# Patient Record
Sex: Female | Born: 1955 | Race: White | Hispanic: No | Marital: Married
Health system: Southern US, Community
[De-identification: ages and names within clinical notes are randomized; demographics above are authoritative.]

## PROBLEM LIST (undated history)

## (undated) DIAGNOSIS — U071 COVID-19: Secondary | ICD-10-CM

## (undated) DIAGNOSIS — J449 Chronic obstructive pulmonary disease, unspecified: Secondary | ICD-10-CM

## (undated) DIAGNOSIS — J80 Acute respiratory distress syndrome: Secondary | ICD-10-CM

## (undated) DIAGNOSIS — J4489 Other specified chronic obstructive pulmonary disease: Secondary | ICD-10-CM

## (undated) DIAGNOSIS — J1282 Pneumonia due to coronavirus disease 2019: Secondary | ICD-10-CM

## (undated) DIAGNOSIS — Z85118 Personal history of other malignant neoplasm of bronchus and lung: Secondary | ICD-10-CM

## (undated) DIAGNOSIS — J9621 Acute and chronic respiratory failure with hypoxia: Secondary | ICD-10-CM

---

## 2020-03-28 ENCOUNTER — Inpatient Hospital Stay: Admission: RE | Admit: 2020-03-28 | Payer: Self-pay | Source: Home / Self Care | Admitting: Diagnostic Radiology

## 2020-03-28 ENCOUNTER — Other Ambulatory Visit (HOSPITAL_COMMUNITY): Payer: Medicare HMO

## 2020-03-28 ENCOUNTER — Inpatient Hospital Stay
Admit: 2020-03-28 | Discharge: 2020-04-25 | Disposition: A | Discharge status: Skilled Nursing Facility | Payer: Medicare HMO | Source: Ambulatory Visit | Attending: Internal Medicine | Admitting: Internal Medicine

## 2020-03-28 DIAGNOSIS — J9621 Acute and chronic respiratory failure with hypoxia: Secondary | ICD-10-CM | POA: Diagnosis present

## 2020-03-28 DIAGNOSIS — J9690 Respiratory failure, unspecified, unspecified whether with hypoxia or hypercapnia: Secondary | ICD-10-CM

## 2020-03-28 DIAGNOSIS — J189 Pneumonia, unspecified organism: Secondary | ICD-10-CM

## 2020-03-28 DIAGNOSIS — J1282 Pneumonia due to coronavirus disease 2019: Secondary | ICD-10-CM | POA: Diagnosis present

## 2020-03-28 DIAGNOSIS — J4489 Other specified chronic obstructive pulmonary disease: Secondary | ICD-10-CM | POA: Diagnosis present

## 2020-03-28 DIAGNOSIS — J45909 Unspecified asthma, uncomplicated: Secondary | ICD-10-CM

## 2020-03-28 DIAGNOSIS — J90 Pleural effusion, not elsewhere classified: Secondary | ICD-10-CM

## 2020-03-28 DIAGNOSIS — U071 COVID-19: Secondary | ICD-10-CM | POA: Diagnosis present

## 2020-03-28 DIAGNOSIS — Z85118 Personal history of other malignant neoplasm of bronchus and lung: Secondary | ICD-10-CM

## 2020-03-28 DIAGNOSIS — R069 Unspecified abnormalities of breathing: Secondary | ICD-10-CM

## 2020-03-28 DIAGNOSIS — R059 Cough, unspecified: Secondary | ICD-10-CM

## 2020-03-28 DIAGNOSIS — J80 Acute respiratory distress syndrome: Secondary | ICD-10-CM | POA: Diagnosis present

## 2020-03-28 DIAGNOSIS — J449 Chronic obstructive pulmonary disease, unspecified: Secondary | ICD-10-CM | POA: Diagnosis present

## 2020-03-28 HISTORY — DX: Acute and chronic respiratory failure with hypoxia: J96.21

## 2020-03-28 HISTORY — DX: Acute respiratory distress syndrome: J80

## 2020-03-28 HISTORY — DX: Pneumonia due to coronavirus disease 2019: J12.82

## 2020-03-28 HISTORY — DX: Other specified chronic obstructive pulmonary disease: J44.89

## 2020-03-28 HISTORY — DX: Chronic obstructive pulmonary disease, unspecified: J44.9

## 2020-03-28 HISTORY — DX: Personal history of other malignant neoplasm of bronchus and lung: Z85.118

## 2020-03-28 HISTORY — DX: COVID-19: U07.1

## 2020-03-29 ENCOUNTER — Encounter: Payer: Self-pay | Admitting: Internal Medicine

## 2020-03-29 DIAGNOSIS — J80 Acute respiratory distress syndrome: Secondary | ICD-10-CM | POA: Diagnosis not present

## 2020-03-29 DIAGNOSIS — J1282 Pneumonia due to coronavirus disease 2019: Secondary | ICD-10-CM

## 2020-03-29 DIAGNOSIS — J9621 Acute and chronic respiratory failure with hypoxia: Secondary | ICD-10-CM

## 2020-03-29 DIAGNOSIS — U071 COVID-19: Secondary | ICD-10-CM

## 2020-03-29 DIAGNOSIS — J449 Chronic obstructive pulmonary disease, unspecified: Secondary | ICD-10-CM | POA: Diagnosis not present

## 2020-03-29 DIAGNOSIS — Z85118 Personal history of other malignant neoplasm of bronchus and lung: Secondary | ICD-10-CM

## 2020-03-29 LAB — COMPREHENSIVE METABOLIC PANEL
ALT: 26 U/L (ref 0–44)
AST: 20 U/L (ref 15–41)
Albumin: 3.2 g/dL — ABNORMAL LOW (ref 3.5–5.0)
Alkaline Phosphatase: 39 U/L (ref 38–126)
Anion gap: 10 (ref 5–15)
BUN: 21 mg/dL (ref 8–23)
CO2: 28 mmol/L (ref 22–32)
Calcium: 8.7 mg/dL — ABNORMAL LOW (ref 8.9–10.3)
Chloride: 102 mmol/L (ref 98–111)
Creatinine, Ser: 0.68 mg/dL (ref 0.44–1.00)
GFR, Estimated: >60 mL/min (ref >60)
Glucose, Bld: 84 mg/dL (ref 70–99)
Potassium: 4.5 mmol/L (ref 3.5–5.1)
Sodium: 140 mmol/L (ref 135–145)
Total Bilirubin: 0.7 mg/dL (ref 0.3–1.2)
Total Protein: 6.4 g/dL — ABNORMAL LOW (ref 6.5–8.1)

## 2020-03-29 LAB — CBC WITH DIFFERENTIAL/PLATELET
Abs Immature Granulocytes: 0.26 10*3/uL — ABNORMAL HIGH (ref 0.00–0.07)
Basophils Absolute: 0 10*3/uL (ref 0.0–0.1)
Basophils Relative: 0 %
Eosinophils Absolute: 0 10*3/uL (ref 0.0–0.5)
Eosinophils Relative: 0 %
HCT: 32.8 % — ABNORMAL LOW (ref 36.0–46.0)
Hemoglobin: 10.3 g/dL — ABNORMAL LOW (ref 12.0–15.0)
Immature Granulocytes: 2 %
Lymphocytes Relative: 25 %
Lymphs Abs: 3.5 10*3/uL (ref 0.7–4.0)
MCH: 29 pg (ref 26.0–34.0)
MCHC: 31.4 g/dL (ref 30.0–36.0)
MCV: 92.4 fL (ref 80.0–100.0)
Monocytes Absolute: 0.8 10*3/uL (ref 0.1–1.0)
Monocytes Relative: 6 %
Neutro Abs: 9.2 10*3/uL — ABNORMAL HIGH (ref 1.7–7.7)
Neutrophils Relative %: 67 %
Platelets: 346 10*3/uL (ref 150–400)
RBC: 3.55 MIL/uL — ABNORMAL LOW (ref 3.87–5.11)
RDW: 17.2 % — ABNORMAL HIGH (ref 11.5–15.5)
WBC: 13.9 10*3/uL — ABNORMAL HIGH (ref 4.0–10.5)
nRBC: 0.5 % — ABNORMAL HIGH (ref 0.0–0.2)

## 2020-03-29 LAB — MAGNESIUM: Magnesium: 2.3 mg/dL (ref 1.7–2.4)

## 2020-03-29 LAB — PROTIME-INR
INR: 0.9 (ref 0.8–1.2)
Prothrombin Time: 12.2 seconds (ref 11.4–15.2)

## 2020-03-29 LAB — HEMOGLOBIN A1C
Hgb A1c MFr Bld: 6.3 % — ABNORMAL HIGH (ref 4.8–5.6)
Mean Plasma Glucose: 134.11 mg/dL

## 2020-03-29 LAB — PHOSPHORUS: Phosphorus: 3.4 mg/dL (ref 2.5–4.6)

## 2020-03-29 LAB — TSH: TSH: 10.129 u[IU]/mL — ABNORMAL HIGH (ref 0.350–4.500)

## 2020-03-29 NOTE — Progress Notes (Signed)
Pulmonary Critical Care Medicine [facility] [place]  PULMONARY SERVICE  Date of Service: 03/29/2020  PULMONARY CRITICAL CARE Jaclyn Campbell  [id number]  DOB: 1955/08/08   DOA: 03/28/2020  Referring Physician: Carron Curie, MD  [name] is a 64 y.o. female seen for follow up of Acute on Chronic Respiratory Failure.  Patient with multiple medical problems including history of asthma lung cancer status post resection hypertension morbid obesity who presented to the hospital because of shortness of breath increased heart rate.  Patient had a CT chest negative for pulmonary embolism however test positive for COVID-19.  Patient initially had presented with 15 L nonrebreather now she is on high flow oxygen plus nonrebreather.  Patient was treated with antibiotics she had not been vaccinated for COVID-19.  The patient continued to have high oxygen requirements at the other facility transferred to our facility for further management.  At the time that she is seen she is visibly short of breath drops easily down into the 70s with minimal activity but recovers fairly quickly.  She is a full code.  Review of Systems:  ROS performed and is unremarkable other than noted above.  PAST MEDICAL HISTORY Past Medical History:  Diagnosis Date  . Asthma  . Hypertension  . Lung cancer (HCC)    PAST SURGICAL HISTORY Past Surgical History:  Procedure Laterality Date  . HX SURGICAL OTHER Left 2008  lobe of lumg removed due to cancer   ALLERGIES No Known Allergies  Family history Noncontributory  Social history: Former smoker No alcohol or drug abuse  Medications: Reviewed on Rounds  Physical Exam:  Vitals: Temperature is 97.0 pulse 86 respiratory rate 17 blood pressure is 140/71 saturations 100%  Ventilator Settings on high flow oxygen 30 L with 100% nonrebreather also  . General: Comfortable at this time . Eyes: Grossly normal lids, irises &  conjunctiva . ENT: grossly tongue is normal . Neck: no obvious mass . Cardiovascular: S1-S2 normal no gallop or rub . Respiratory: No rhonchi very coarse breath sounds . Abdomen: Soft and nontender . Skin: no rash seen on limited exam . Musculoskeletal: not rigid . Psychiatric:unable to assess . Neurologic: no seizure no involuntary movements         Labs on Admission:  Basic Metabolic Panel: Recent Labs  Lab 03/29/20 0447  NA 140  K 4.5  CL 102  CO2 28  GLUCOSE 84  BUN 21  CREATININE 0.68  CALCIUM 8.7*  MG 2.3  PHOS 3.4    No results for input(s): PHART, PCO2ART, PO2ART, HCO3, O2SAT in the last 168 hours.  Liver Function Tests: Recent Labs  Lab 03/29/20 0447  AST 20  ALT 26  ALKPHOS 39  BILITOT 0.7  PROT 6.4*  ALBUMIN 3.2*   No results for input(s): LIPASE, AMYLASE in the last 168 hours. No results for input(s): AMMONIA in the last 168 hours.  CBC: Recent Labs  Lab 03/29/20 0447  WBC 13.9*  NEUTROABS 9.2*  HGB 10.3*  HCT 32.8*  MCV 92.4  PLT 346    Cardiac Enzymes: No results for input(s): CKTOTAL, CKMB, CKMBINDEX, TROPONINI in the last 168 hours.  BNP (last 3 results) No results for input(s): BNP in the last 8760 hours.  ProBNP (last 3 results) No results for input(s): PROBNP in the last 8760 hours.   Radiological Exams on Admission: DG Abd 1 View  Result Date: 03/28/2020 CLINICAL DATA:  Pneumonia EXAM: ABDOMEN - 1 VIEW COMPARISON:  None. FINDINGS:  Unremarkable bowel gas pattern. Vascular calcifications in the lower anatomic pelvis. Probable degenerative disc disease at L4-5 and possibly L5-S1 with mild levoconvex lumbar scoliosis. Mild axial degenerative chondral thinning in both hips. IMPRESSION: 1. Unremarkable bowel gas pattern. 2. Degenerative disc disease at L4-5 and possibly L5-S1 with mild levoconvex lumbar scoliosis. Electronically Signed   By: Gaylyn Rong M.D.   On: 03/28/2020 18:24   DG CHEST PORT 1 VIEW  Result Date:  03/28/2020 CLINICAL DATA:  Pneumonia EXAM: PORTABLE CHEST 1 VIEW COMPARISON:  None. FINDINGS: Atherosclerotic calcification of the aortic arch. Vascular clips project over the left hilum and over the neck. The neck clips are probably from prior thyroidectomy based on the pattern. Discontinuity in likely osteotomy of the left fifth rib, correlate with operative history. Thoracic spondylosis. Low lung volumes are present, causing crowding of the pulmonary vasculature. Hazy interstitial accentuation is present bilaterally in the lungs in could be from noncardiogenic edema or atypical pneumonia. Mild blunting of the left lateral costophrenic angle could be postoperative or from a trace pleural effusion. IMPRESSION: 1. Hazy interstitial accentuation bilaterally in the lungs, noncardiogenic edema versus atypical pneumonia. 2. Mild blunting of the left lateral costophrenic angle could be postoperative or from a trace pleural effusion. 3. Atherosclerotic calcification of the aortic arch. Electronically Signed   By: Gaylyn Rong M.D.   On: 03/28/2020 18:23    Assessment/Plan Active Problems:   Acute on chronic respiratory failure with hypoxia (HCC)   COVID-19 virus infection   Acute respiratory distress syndrome (ARDS) (HCC)   Pneumonia due to COVID-19 virus   Chronic obstructive asthma (with obstructive pulmonary disease) (HCC)   History of lung cancer   1. Acute on chronic respiratory failure with hypoxia patient is severely hypoxic has multiple factors including history of lung cancer with resection COPD and then on top of that she had COVID-19 infection.  My concern here is that she is quite frail right now in terms of her oxygenation and she may end up having to be intubated. 2. COVID-19 virus infection in recovery phase still severely hypoxic as noted above. 3. ARDS patient has improved although very slightly radiologically she still has significant bilateral infiltrates. 4. Pneumonia due to  COVID-19 as above has received antibiotics at the other facility we will continue with supportive care. 5. Chronic obstructive asthma she needs to be on nebulizers and consider also a burst of steroids. 6. History of lung cancer status post resection  I have personally seen and evaluated the patient, evaluated laboratory and imaging results, formulated the assessment and plan and placed orders. The Patient requires high complexity decision making with multiple systems involvement.  Case was discussed on Rounds with the Respiratory Therapy Director and the Respiratory staff Time Spent patient is critically ill in danger of cardiac arrest and death  Yevonne Pax, MD [facility] Pulmonary Critical Care Medicine Sleep Medicine

## 2020-03-30 DIAGNOSIS — J9621 Acute and chronic respiratory failure with hypoxia: Secondary | ICD-10-CM | POA: Diagnosis not present

## 2020-03-30 DIAGNOSIS — J80 Acute respiratory distress syndrome: Secondary | ICD-10-CM | POA: Diagnosis not present

## 2020-03-30 DIAGNOSIS — U071 COVID-19: Secondary | ICD-10-CM | POA: Diagnosis not present

## 2020-03-30 DIAGNOSIS — J449 Chronic obstructive pulmonary disease, unspecified: Secondary | ICD-10-CM | POA: Diagnosis not present

## 2020-03-30 NOTE — Progress Notes (Addendum)
Pulmonary Critical Care Medicine [facility] [place]   PULMONARY CRITICAL CARE SERVICE  PROGRESS NOTE  Date of Service: 03/30/2020  Jaclyn Campbell  WVP:[id number]  DOB: 06-03-1955   DOA: 03/28/2020  Referring Physician: Carron Curie, MD  [name] is a 64 y.o. female seen for follow up of Acute on Chronic Respiratory Failure.  Patient remains critically ill requiring high flow oxygen currently is on 30 L she desaturates easily still at risk for respiratory failure and increased chance of being intubated  Medications: Reviewed on Rounds  Physical Exam:  Vitals: Temperature is 96.8 pulse 82 respiratory rate 17 blood pressure is 127/73 saturations 96%  Ventilator Settings on high flow nasal cannula on 30 L of oxygen requiring 50% FiO2  . General: Comfortable at this time . Eyes: Grossly normal lids, irises & conjunctiva . ENT: grossly tongue is normal . Neck: no obvious mass . Cardiovascular: S1 S2 normal no gallop . Respiratory: Scattered rhonchi expansion is equal . Abdomen: soft . Skin: no rash seen on limited exam . Musculoskeletal: not rigid . Psychiatric:unable to assess . Neurologic: no seizure no involuntary movements         Lab Data:   Basic Metabolic Panel: Recent Labs  Lab 03/29/20 0447  NA 140  K 4.5  CL 102  CO2 28  GLUCOSE 84  BUN 21  CREATININE 0.68  CALCIUM 8.7*  MG 2.3  PHOS 3.4    ABG: No results for input(s): PHART, PCO2ART, PO2ART, HCO3, O2SAT in the last 168 hours.  Liver Function Tests: Recent Labs  Lab 03/29/20 0447  AST 20  ALT 26  ALKPHOS 39  BILITOT 0.7  PROT 6.4*  ALBUMIN 3.2*   No results for input(s): LIPASE, AMYLASE in the last 168 hours. No results for input(s): AMMONIA in the last 168 hours.  CBC: Recent Labs  Lab 03/29/20 0447  WBC 13.9*  NEUTROABS 9.2*  HGB 10.3*  HCT 32.8*  MCV 92.4  PLT 346    Cardiac Enzymes: No results for input(s): CKTOTAL, CKMB, CKMBINDEX, TROPONINI in the last  168 hours.  BNP (last 3 results) No results for input(s): BNP in the last 8760 hours.  ProBNP (last 3 results) No results for input(s): PROBNP in the last 8760 hours.  Radiological Exams: DG Abd 1 View  Result Date: 03/28/2020 CLINICAL DATA:  Pneumonia EXAM: ABDOMEN - 1 VIEW COMPARISON:  None. FINDINGS: Unremarkable bowel gas pattern. Vascular calcifications in the lower anatomic pelvis. Probable degenerative disc disease at L4-5 and possibly L5-S1 with mild levoconvex lumbar scoliosis. Mild axial degenerative chondral thinning in both hips. IMPRESSION: 1. Unremarkable bowel gas pattern. 2. Degenerative disc disease at L4-5 and possibly L5-S1 with mild levoconvex lumbar scoliosis. Electronically Signed   By: Gaylyn Rong M.D.   On: 03/28/2020 18:24   DG CHEST PORT 1 VIEW  Result Date: 03/28/2020 CLINICAL DATA:  Pneumonia EXAM: PORTABLE CHEST 1 VIEW COMPARISON:  None. FINDINGS: Atherosclerotic calcification of the aortic arch. Vascular clips project over the left hilum and over the neck. The neck clips are probably from prior thyroidectomy based on the pattern. Discontinuity in likely osteotomy of the left fifth rib, correlate with operative history. Thoracic spondylosis. Low lung volumes are present, causing crowding of the pulmonary vasculature. Hazy interstitial accentuation is present bilaterally in the lungs in could be from noncardiogenic edema or atypical pneumonia. Mild blunting of the left lateral costophrenic angle could be postoperative or from a trace pleural effusion. IMPRESSION: 1. Hazy interstitial accentuation bilaterally  in the lungs, noncardiogenic edema versus atypical pneumonia. 2. Mild blunting of the left lateral costophrenic angle could be postoperative or from a trace pleural effusion. 3. Atherosclerotic calcification of the aortic arch. Electronically Signed   By: Gaylyn Rong M.D.   On: 03/28/2020 18:23    Assessment/Plan Active Problems:   Acute on chronic  respiratory failure with hypoxia (HCC)   COVID-19 virus infection   Acute respiratory distress syndrome (ARDS) (HCC)   Pneumonia due to COVID-19 virus   Chronic obstructive asthma (with obstructive pulmonary disease) (HCC)   History of lung cancer   1. Acute on chronic respiratory failure with hypoxia we will continue with titrating oxygen as tolerated.  She remains high risk of intubation.  Right now she has not been on BiPAP may need to consider using BiPAP at nighttime we will see how the patient does 2. COVID-19 virus infection recovery phase right now still severe hypoxemia 3. ARDS chest x-ray with residual changes although technically in recovery 4. Pneumonia due to COVID-19 has been treated with antibiotics 5. Chronic obstructive asthma continue with pulmonary management. 6. History of lung cancer status post resection   I have personally seen and evaluated the patient, evaluated laboratory and imaging results, formulated the assessment and plan and placed orders. The Patient requires high complexity decision making with multiple systems involvement.  Rounds were done with the Respiratory Therapy Director and Staff therapists and discussed with nursing staff also.  Patient is critically ill in danger of cardiac arrest and death critical care time 35-minute  Yevonne Pax, MD [facility] Pulmonary Critical Care Medicine Sleep Medicine

## 2020-03-31 DIAGNOSIS — J9621 Acute and chronic respiratory failure with hypoxia: Secondary | ICD-10-CM | POA: Diagnosis not present

## 2020-03-31 DIAGNOSIS — J80 Acute respiratory distress syndrome: Secondary | ICD-10-CM | POA: Diagnosis not present

## 2020-03-31 DIAGNOSIS — J449 Chronic obstructive pulmonary disease, unspecified: Secondary | ICD-10-CM | POA: Diagnosis not present

## 2020-03-31 DIAGNOSIS — U071 COVID-19: Secondary | ICD-10-CM | POA: Diagnosis not present

## 2020-03-31 LAB — CBC
HCT: 31.9 % — ABNORMAL LOW (ref 36.0–46.0)
Hemoglobin: 9.9 g/dL — ABNORMAL LOW (ref 12.0–15.0)
MCH: 28.9 pg (ref 26.0–34.0)
MCHC: 31 g/dL (ref 30.0–36.0)
MCV: 93 fL (ref 80.0–100.0)
Platelets: 329 10*3/uL (ref 150–400)
RBC: 3.43 MIL/uL — ABNORMAL LOW (ref 3.87–5.11)
RDW: 17.1 % — ABNORMAL HIGH (ref 11.5–15.5)
WBC: 13 10*3/uL — ABNORMAL HIGH (ref 4.0–10.5)
nRBC: 0.7 % — ABNORMAL HIGH (ref 0.0–0.2)

## 2020-03-31 LAB — URINE CULTURE: Culture: >=100000 — AB

## 2020-03-31 LAB — BASIC METABOLIC PANEL
Anion gap: 11 (ref 5–15)
BUN: 16 mg/dL (ref 8–23)
CO2: 28 mmol/L (ref 22–32)
Calcium: 8.6 mg/dL — ABNORMAL LOW (ref 8.9–10.3)
Chloride: 103 mmol/L (ref 98–111)
Creatinine, Ser: 0.66 mg/dL (ref 0.44–1.00)
GFR, Estimated: >60 mL/min (ref >60)
Glucose, Bld: 64 mg/dL — ABNORMAL LOW (ref 70–99)
Potassium: 4.3 mmol/L (ref 3.5–5.1)
Sodium: 142 mmol/L (ref 135–145)

## 2020-03-31 LAB — T4, FREE: Free T4: 1.17 ng/dL — ABNORMAL HIGH (ref 0.61–1.12)

## 2020-03-31 LAB — PHOSPHORUS: Phosphorus: 3.3 mg/dL (ref 2.5–4.6)

## 2020-03-31 LAB — MAGNESIUM: Magnesium: 2.1 mg/dL (ref 1.7–2.4)

## 2020-03-31 LAB — TSH: TSH: 15.457 u[IU]/mL — ABNORMAL HIGH (ref 0.350–4.500)

## 2020-03-31 NOTE — Progress Notes (Signed)
Pulmonary Critical Care Medicine [facility] [place]   PULMONARY CRITICAL CARE SERVICE  PROGRESS NOTE  Date of Service: 03/31/2020  Jaclyn Campbell  [id number]  DOB: 1955-11-30   DOA: 03/28/2020  Referring Physician: Carron Curie, MD  [name] is a 64 y.o. female seen for follow up of Acute on Chronic Respiratory Failure.  Remains on heated high flow she does desaturate quite easily and requires 100% nonrebreather on top of her heated high flow.  Medications: Reviewed on Rounds  Physical Exam:  Vitals: Temperature is 96.0 pulse 87 respiratory rate 32 blood pressure is 120/67 saturations 99%  Ventilator Settings currently is on heated high flow 30 L with 50% FiO2  . General: Comfortable at this time . Eyes: Grossly normal lids, irises & conjunctiva . ENT: grossly tongue is normal . Neck: no obvious mass . Cardiovascular: S1 S2 normal no gallop . Respiratory: No rhonchi very coarse breath sounds . Abdomen: soft . Skin: no rash seen on limited exam . Musculoskeletal: not rigid . Psychiatric:unable to assess . Neurologic: no seizure no involuntary movements         Lab Data:   Basic Metabolic Panel: Recent Labs  Lab 03/29/20 0447 03/31/20 0627  NA 140 142  K 4.5 4.3  CL 102 103  CO2 28 28  GLUCOSE 84 64*  BUN 21 16  CREATININE 0.68 0.66  CALCIUM 8.7* 8.6*  MG 2.3 2.1  PHOS 3.4 3.3    ABG: No results for input(s): PHART, PCO2ART, PO2ART, HCO3, O2SAT in the last 168 hours.  Liver Function Tests: Recent Labs  Lab 03/29/20 0447  AST 20  ALT 26  ALKPHOS 39  BILITOT 0.7  PROT 6.4*  ALBUMIN 3.2*   No results for input(s): LIPASE, AMYLASE in the last 168 hours. No results for input(s): AMMONIA in the last 168 hours.  CBC: Recent Labs  Lab 03/29/20 0447 03/31/20 0627  WBC 13.9* 13.0*  NEUTROABS 9.2*  --   HGB 10.3* 9.9*  HCT 32.8* 31.9*  MCV 92.4 93.0  PLT 346 329    Cardiac Enzymes: No results for input(s): CKTOTAL, CKMB,  CKMBINDEX, TROPONINI in the last 168 hours.  BNP (last 3 results) No results for input(s): BNP in the last 8760 hours.  ProBNP (last 3 results) No results for input(s): PROBNP in the last 8760 hours.  Radiological Exams: No results found.  Assessment/Plan Active Problems:   Acute on chronic respiratory failure with hypoxia (HCC)   COVID-19 virus infection   Acute respiratory distress syndrome (ARDS) (HCC)   Pneumonia due to COVID-19 virus   Chronic obstructive asthma (with obstructive pulmonary disease) (HCC)   History of lung cancer   1. Acute on chronic respiratory failure hypoxia we will continue with heated high flow supplemented by 100% FiO2 her overall prognosis very guarded spoke with her at length and explained to her that she could turn for the worse and required intubation but we are going to obviously hold off on that as long as we possibly can 2. COVID-19 virus infection in recovery we will continue with supportive care. 3. ARDS severe disease with severe hypoxia still present we will continue with oxygen supplementation and monitor closely. 4. Chronic obstructive asthma nebulizers as necessary 5. History of lung cancer with resection   I have personally seen and evaluated the patient, evaluated laboratory and imaging results, formulated the assessment and plan and placed orders. The Patient requires high complexity decision making with multiple systems involvement.  Rounds were  done with the Respiratory Therapy Director and Staff therapists and discussed with nursing staff also.  Allyne Gee, MD [facility] Pulmonary Critical Care Medicine Sleep Medicine

## 2020-04-01 DIAGNOSIS — J449 Chronic obstructive pulmonary disease, unspecified: Secondary | ICD-10-CM | POA: Diagnosis not present

## 2020-04-01 DIAGNOSIS — U071 COVID-19: Secondary | ICD-10-CM | POA: Diagnosis not present

## 2020-04-01 DIAGNOSIS — J80 Acute respiratory distress syndrome: Secondary | ICD-10-CM | POA: Diagnosis not present

## 2020-04-01 DIAGNOSIS — J9621 Acute and chronic respiratory failure with hypoxia: Secondary | ICD-10-CM | POA: Diagnosis not present

## 2020-04-01 NOTE — Progress Notes (Signed)
Pulmonary Critical Care Medicine [facility] [place]   PULMONARY CRITICAL CARE SERVICE  PROGRESS NOTE  Date of Service: 04/01/2020  Jaclyn Campbell  [id number]  DOB: [birth year]   DOA: 03/28/2020  Referring Physician: Carron Curie, MD  [name] is a 64 y.o. female seen for follow up of Acute on Chronic Respiratory Failure.  Patient currently is on 30 L of oxygen with 50% FiO2 still has easy desaturations on ambulation  Medications: Reviewed on Rounds  Physical Exam:  Vitals: Temperature is 96.7 pulse 76 respiratory 15 blood pressure is 131/76 saturations 100%  Ventilator Settings on 30 L with an FiO2 of 50%  . General: Comfortable at this time . Eyes: Grossly normal lids, irises & conjunctiva . ENT: grossly tongue is normal . Neck: no obvious mass . Cardiovascular: S1 S2 normal no gallop . Respiratory: No rhonchi very coarse breath sounds . Abdomen: soft . Skin: no rash seen on limited exam . Musculoskeletal: not rigid . Psychiatric:unable to assess . Neurologic: no seizure no involuntary movements         Lab Data:   Basic Metabolic Panel: Recent Labs  Lab 03/29/20 0447 03/31/20 0627  NA 140 142  K 4.5 4.3  CL 102 103  CO2 28 28  GLUCOSE 84 64*  BUN 21 16  CREATININE 0.68 0.66  CALCIUM 8.7* 8.6*  MG 2.3 2.1  PHOS 3.4 3.3    ABG: No results for input(s): PHART, PCO2ART, PO2ART, HCO3, O2SAT in the last 168 hours.  Liver Function Tests: Recent Labs  Lab 03/29/20 0447  AST 20  ALT 26  ALKPHOS 39  BILITOT 0.7  PROT 6.4*  ALBUMIN 3.2*   No results for input(s): LIPASE, AMYLASE in the last 168 hours. No results for input(s): AMMONIA in the last 168 hours.  CBC: Recent Labs  Lab 03/29/20 0447 03/31/20 0627  WBC 13.9* 13.0*  NEUTROABS 9.2*  --   HGB 10.3* 9.9*  HCT 32.8* 31.9*  MCV 92.4 93.0  PLT 346 329    Cardiac Enzymes: No results for input(s): CKTOTAL, CKMB, CKMBINDEX, TROPONINI in the last 168 hours.  BNP (last  3 results) No results for input(s): BNP in the last 8760 hours.  ProBNP (last 3 results) No results for input(s): PROBNP in the last 8760 hours.  Radiological Exams: No results found.  Assessment/Plan Active Problems:   Acute on chronic respiratory failure with hypoxia (HCC)   COVID-19 virus infection   Acute respiratory distress syndrome (ARDS) (HCC)   Pneumonia due to COVID-19 virus   Chronic obstructive asthma (with obstructive pulmonary disease) (HCC)   History of lung cancer   1. Acute on chronic respiratory failure hypoxia we will continue with oxygen therapy at 30L flow rate with currently on 50% FiO2 titrate oxygen down as tolerated.  Patient still has significant desaturations noted 2. COVID-19 virus infection recovery 3. ARDS very slow to improve 4. Pneumonia due to COVID-19 treated 5. Chronic asthma at baseline 6. Lung cancer status post resection   I have personally seen and evaluated the patient, evaluated laboratory and imaging results, formulated the assessment and plan and placed orders. The Patient requires high complexity decision making with multiple systems involvement.  Rounds were done with the Respiratory Therapy Director and Staff therapists and discussed with nursing staff also.  Yevonne Pax, MD [facility] Pulmonary Critical Care Medicine Sleep Medicine

## 2020-04-02 DIAGNOSIS — U071 COVID-19: Secondary | ICD-10-CM | POA: Diagnosis not present

## 2020-04-02 DIAGNOSIS — J449 Chronic obstructive pulmonary disease, unspecified: Secondary | ICD-10-CM | POA: Diagnosis not present

## 2020-04-02 DIAGNOSIS — J9621 Acute and chronic respiratory failure with hypoxia: Secondary | ICD-10-CM | POA: Diagnosis not present

## 2020-04-02 DIAGNOSIS — J80 Acute respiratory distress syndrome: Secondary | ICD-10-CM | POA: Diagnosis not present

## 2020-04-02 NOTE — Progress Notes (Signed)
Pulmonary Critical Care Medicine [facility] [place]   PULMONARY CRITICAL CARE SERVICE  PROGRESS NOTE  Date of Service: 04/02/2020  Jaclyn Campbell  [id number]  DOB: 06-20-1955   DOA: 03/28/2020  Referring Physician: Carron Curie, MD  [name] is a 64 y.o. female seen for follow up of Acute on Chronic Respiratory Failure.  Patient currently is on 30 L of oxygen has been requiring 50% FiO2.  She appears to be more comfortable today  Medications: Reviewed on Rounds  Physical Exam:  Vitals: Temperature is 97.6 pulse of 97 respiratory rate 18 blood pressure is 117/78 saturations are 97%  Ventilator Settings currently on 30 L with an FiO2 of 50%  . General: Comfortable at this time . Eyes: Grossly normal lids, irises & conjunctiva . ENT: grossly tongue is normal . Neck: no obvious mass . Cardiovascular: S1 S2 normal no gallop . Respiratory: No rhonchi very coarse breath sounds . Abdomen: soft . Skin: no rash seen on limited exam . Musculoskeletal: not rigid . Psychiatric:unable to assess . Neurologic: no seizure no involuntary movements         Lab Data:   Basic Metabolic Panel: Recent Labs  Lab 03/29/20 0447 03/31/20 0627  NA 140 142  K 4.5 4.3  CL 102 103  CO2 28 28  GLUCOSE 84 64*  BUN 21 16  CREATININE 0.68 0.66  CALCIUM 8.7* 8.6*  MG 2.3 2.1  PHOS 3.4 3.3    ABG: No results for input(s): PHART, PCO2ART, PO2ART, HCO3, O2SAT in the last 168 hours.  Liver Function Tests: Recent Labs  Lab 03/29/20 0447  AST 20  ALT 26  ALKPHOS 39  BILITOT 0.7  PROT 6.4*  ALBUMIN 3.2*   No results for input(s): LIPASE, AMYLASE in the last 168 hours. No results for input(s): AMMONIA in the last 168 hours.  CBC: Recent Labs  Lab 03/29/20 0447 03/31/20 0627  WBC 13.9* 13.0*  NEUTROABS 9.2*  --   HGB 10.3* 9.9*  HCT 32.8* 31.9*  MCV 92.4 93.0  PLT 346 329    Cardiac Enzymes: No results for input(s): CKTOTAL, CKMB, CKMBINDEX, TROPONINI  in the last 168 hours.  BNP (last 3 results) No results for input(s): BNP in the last 8760 hours.  ProBNP (last 3 results) No results for input(s): PROBNP in the last 8760 hours.  Radiological Exams: No results found.  Assessment/Plan Active Problems:   Acute on chronic respiratory failure with hypoxia (HCC)   COVID-19 virus infection   Acute respiratory distress syndrome (ARDS) (HCC)   Pneumonia due to COVID-19 virus   Chronic obstructive asthma (with obstructive pulmonary disease) (HCC)   History of lung cancer   1. Acute on chronic respiratory failure with hypoxia we will continue with the oxygen at 30 L try to titrate the FiO2 down. 2. COVID-19 virus infection in recovery phase we will continue to monitor 3. ARDS treated slowly improving 4. Pneumonia due to COVID-19 appears to be improving 5. Chronic obstructive asthma patient is at baseline 6. History of lung cancer no change   I have personally seen and evaluated the patient, evaluated laboratory and imaging results, formulated the assessment and plan and placed orders. The Patient requires high complexity decision making with multiple systems involvement.  Rounds were done with the Respiratory Therapy Director and Staff therapists and discussed with nursing staff also.  Yevonne Pax, MD [facility] Pulmonary Critical Care Medicine Sleep Medicine

## 2020-04-03 DIAGNOSIS — J9621 Acute and chronic respiratory failure with hypoxia: Secondary | ICD-10-CM | POA: Diagnosis not present

## 2020-04-03 DIAGNOSIS — U071 COVID-19: Secondary | ICD-10-CM | POA: Diagnosis not present

## 2020-04-03 DIAGNOSIS — J80 Acute respiratory distress syndrome: Secondary | ICD-10-CM | POA: Diagnosis not present

## 2020-04-03 DIAGNOSIS — J449 Chronic obstructive pulmonary disease, unspecified: Secondary | ICD-10-CM | POA: Diagnosis not present

## 2020-04-03 LAB — VANCOMYCIN, TROUGH: Vancomycin Tr: 20 ug/mL (ref 15–20)

## 2020-04-04 DIAGNOSIS — J80 Acute respiratory distress syndrome: Secondary | ICD-10-CM | POA: Diagnosis not present

## 2020-04-04 DIAGNOSIS — J449 Chronic obstructive pulmonary disease, unspecified: Secondary | ICD-10-CM | POA: Diagnosis not present

## 2020-04-04 DIAGNOSIS — U071 COVID-19: Secondary | ICD-10-CM | POA: Diagnosis not present

## 2020-04-04 DIAGNOSIS — J9621 Acute and chronic respiratory failure with hypoxia: Secondary | ICD-10-CM | POA: Diagnosis not present

## 2020-04-04 NOTE — Progress Notes (Signed)
Pulmonary Critical Care Medicine [facility] [place]   PULMONARY CRITICAL CARE SERVICE  PROGRESS NOTE  Date of Service: 04/04/2020  Jaclyn Campbell  [id number]  DOB: 01/24/1956   DOA: 03/28/2020  Referring Physician: Carron Curie, MD  [name] is a 64 y.o. female seen for follow up of Acute on Chronic Respiratory Failure.  Doing little bit better oxygen is down to 25 L with 45% FiO2.  Discussed possibility of placing her on Oxymizer respiratory therapy wants to hold off for now as she still does desaturate rather easily  Medications: Reviewed on Rounds  Physical Exam:  Vitals: Temperature is 97.3 pulse 92 respiratory rate 18 blood pressure is 150/87 saturations 93%  Ventilator Settings on 25 L with an FiO2 of 45%  . General: Comfortable at this time . Eyes: Grossly normal lids, irises & conjunctiva . ENT: grossly tongue is normal . Neck: no obvious mass . Cardiovascular: S1 S2 normal no gallop . Respiratory: No rhonchi very coarse breath . Abdomen: soft . Skin: no rash seen on limited exam . Musculoskeletal: not rigid . Psychiatric:unable to assess . Neurologic: no seizure no involuntary movements         Lab Data:   Basic Metabolic Panel: Recent Labs  Lab 03/29/20 0447 03/31/20 0627  NA 140 142  K 4.5 4.3  CL 102 103  CO2 28 28  GLUCOSE 84 64*  BUN 21 16  CREATININE 0.68 0.66  CALCIUM 8.7* 8.6*  MG 2.3 2.1  PHOS 3.4 3.3    ABG: No results for input(s): PHART, PCO2ART, PO2ART, HCO3, O2SAT in the last 168 hours.  Liver Function Tests: Recent Labs  Lab 03/29/20 0447  AST 20  ALT 26  ALKPHOS 39  BILITOT 0.7  PROT 6.4*  ALBUMIN 3.2*   No results for input(s): LIPASE, AMYLASE in the last 168 hours. No results for input(s): AMMONIA in the last 168 hours.  CBC: Recent Labs  Lab 03/29/20 0447 03/31/20 0627  WBC 13.9* 13.0*  NEUTROABS 9.2*  --   HGB 10.3* 9.9*  HCT 32.8* 31.9*  MCV 92.4 93.0  PLT 346 329    Cardiac  Enzymes: No results for input(s): CKTOTAL, CKMB, CKMBINDEX, TROPONINI in the last 168 hours.  BNP (last 3 results) No results for input(s): BNP in the last 8760 hours.  ProBNP (last 3 results) No results for input(s): PROBNP in the last 8760 hours.  Radiological Exams: No results found.  Assessment/Plan Active Problems:   Acute on chronic respiratory failure with hypoxia (HCC)   COVID-19 virus infection   Acute respiratory distress syndrome (ARDS) (HCC)   Pneumonia due to COVID-19 virus   Chronic obstructive asthma (with obstructive pulmonary disease) (HCC)   History of lung cancer   1. Acute on chronic respiratory failure hypoxia patient currently is on 25 L 45% FiO2.  Issues with anxiety are being addressed by the primary care team plan is to continue with slowly weaning the oxygen. 2. COVID-19 virus infection in recovery we will continue with supportive care 3. ARDS resolving but still with high oxygen requirements 4. Pneumonia due to COVID-19 has been treated patient did receive antibiotics at the other facility 5. Chronic obstructive asthma nebulizers as necessary 6. History of lung cancer status post resection   I have personally seen and evaluated the patient, evaluated laboratory and imaging results, formulated the assessment and plan and placed orders. The Patient requires high complexity decision making with multiple systems involvement.  Rounds were done with the  Respiratory Therapy Director and Staff therapists and discussed with nursing staff also.  Allyne Gee, MD [facility] Pulmonary Critical Care Medicine Sleep Medicine

## 2020-04-04 NOTE — Progress Notes (Signed)
Pulmonary Critical Care Medicine [facility] [place]   PULMONARY CRITICAL CARE SERVICE  PROGRESS NOTE  Date of Service: 04/03/2020  Jaclyn Campbell  [id number]  DOB: 08-15-1955   DOA: 03/28/2020  Referring Physician: Carron Curie, MD  [name] is a 64 y.o. female seen for follow up of Acute on Chronic Respiratory Failure.  Patient remains on heated high flow right now has been on 25 L down to 45% FiO2  Medications: Reviewed on Rounds  Physical Exam:  Vitals: Temperature 97.1 pulse 83 respiratory rate 17 blood pressure is 121/72  Ventilator Settings off the ventilator on high flow oxygen right now is 25 L FiO2 45%  . General: Comfortable at this time . Eyes: Grossly normal lids, irises & conjunctiva . ENT: grossly tongue is normal . Neck: no obvious mass . Cardiovascular: S1 S2 normal no gallop . Respiratory: No rhonchi no rales are noted at this time . Abdomen: soft . Skin: no rash seen on limited exam . Musculoskeletal: not rigid . Psychiatric:unable to assess . Neurologic: no seizure no involuntary movements         Lab Data:   Basic Metabolic Panel: Recent Labs  Lab 03/29/20 0447 03/31/20 0627  NA 140 142  K 4.5 4.3  CL 102 103  CO2 28 28  GLUCOSE 84 64*  BUN 21 16  CREATININE 0.68 0.66  CALCIUM 8.7* 8.6*  MG 2.3 2.1  PHOS 3.4 3.3    ABG: No results for input(s): PHART, PCO2ART, PO2ART, HCO3, O2SAT in the last 168 hours.  Liver Function Tests: Recent Labs  Lab 03/29/20 0447  AST 20  ALT 26  ALKPHOS 39  BILITOT 0.7  PROT 6.4*  ALBUMIN 3.2*   No results for input(s): LIPASE, AMYLASE in the last 168 hours. No results for input(s): AMMONIA in the last 168 hours.  CBC: Recent Labs  Lab 03/29/20 0447 03/31/20 0627  WBC 13.9* 13.0*  NEUTROABS 9.2*  --   HGB 10.3* 9.9*  HCT 32.8* 31.9*  MCV 92.4 93.0  PLT 346 329    Cardiac Enzymes: No results for input(s): CKTOTAL, CKMB, CKMBINDEX, TROPONINI in the last 168  hours.  BNP (last 3 results) No results for input(s): BNP in the last 8760 hours.  ProBNP (last 3 results) No results for input(s): PROBNP in the last 8760 hours.  Radiological Exams: No results found.  Assessment/Plan Active Problems:   Acute on chronic respiratory failure with hypoxia (HCC)   COVID-19 virus infection   Acute respiratory distress syndrome (ARDS) (HCC)   Pneumonia due to COVID-19 virus   Chronic obstructive asthma (with obstructive pulmonary disease) (HCC)   History of lung cancer   1. Acute on chronic respiratory failure hypoxia try to titrate oxygen down as tolerated now is down to 25 L 2. COVID-19 virus infection recovery 3. Acute respiratory distress no improvement 4. Pneumonia due to COVID-19 treated 5. Chronic obstructive asthma inhalers as necessary 6. History of lung cancer status post resection   I have personally seen and evaluated the patient, evaluated laboratory and imaging results, formulated the assessment and plan and placed orders. The Patient requires high complexity decision making with multiple systems involvement.  Rounds were done with the Respiratory Therapy Director and Staff therapists and discussed with nursing staff also.  Yevonne Pax, MD [facility] Pulmonary Critical Care Medicine Sleep Medicine

## 2020-04-05 DIAGNOSIS — U071 COVID-19: Secondary | ICD-10-CM | POA: Diagnosis not present

## 2020-04-05 DIAGNOSIS — J449 Chronic obstructive pulmonary disease, unspecified: Secondary | ICD-10-CM | POA: Diagnosis not present

## 2020-04-05 DIAGNOSIS — J9621 Acute and chronic respiratory failure with hypoxia: Secondary | ICD-10-CM | POA: Diagnosis not present

## 2020-04-05 DIAGNOSIS — J80 Acute respiratory distress syndrome: Secondary | ICD-10-CM | POA: Diagnosis not present

## 2020-04-05 LAB — BASIC METABOLIC PANEL
Anion gap: 10 (ref 5–15)
BUN: 13 mg/dL (ref 8–23)
CO2: 27 mmol/L (ref 22–32)
Calcium: 8.6 mg/dL — ABNORMAL LOW (ref 8.9–10.3)
Chloride: 106 mmol/L (ref 98–111)
Creatinine, Ser: 0.61 mg/dL (ref 0.44–1.00)
GFR, Estimated: >60 mL/min (ref >60)
Glucose, Bld: 57 mg/dL — ABNORMAL LOW (ref 70–99)
Potassium: 3.9 mmol/L (ref 3.5–5.1)
Sodium: 143 mmol/L (ref 135–145)

## 2020-04-05 LAB — CBC
HCT: 28.3 % — ABNORMAL LOW (ref 36.0–46.0)
Hemoglobin: 9.2 g/dL — ABNORMAL LOW (ref 12.0–15.0)
MCH: 30.5 pg (ref 26.0–34.0)
MCHC: 32.5 g/dL (ref 30.0–36.0)
MCV: 93.7 fL (ref 80.0–100.0)
Platelets: 299 10*3/uL (ref 150–400)
RBC: 3.02 MIL/uL — ABNORMAL LOW (ref 3.87–5.11)
RDW: 18.7 % — ABNORMAL HIGH (ref 11.5–15.5)
WBC: 10.6 10*3/uL — ABNORMAL HIGH (ref 4.0–10.5)
nRBC: 0.2 % (ref 0.0–0.2)

## 2020-04-05 LAB — MAGNESIUM: Magnesium: 2 mg/dL (ref 1.7–2.4)

## 2020-04-05 NOTE — Progress Notes (Signed)
Pulmonary Critical Care Medicine [facility] [place]   PULMONARY CRITICAL CARE SERVICE  PROGRESS NOTE  Date of Service: 04/05/2020  Jaclyn Campbell  [id number]  DOB: 22-Apr-1955   DOA: 03/28/2020  Referring Physician: Carron Curie, MD  [name] is a 64 y.o. female seen for follow up of Acute on Chronic Respiratory Failure.  Patient is currently on 7 L Oxymizer is doing better than she was over the last couple of days.  Medications: Reviewed on Rounds  Physical Exam:  Vitals: Temperature 98.1 pulse 89 respiratory rate 18 blood pressure is 159/69 saturations 96%  Ventilator Settings on 7 L Oxymizer  . General: Comfortable at this time . Eyes: Grossly normal lids, irises & conjunctiva . ENT: grossly tongue is normal . Neck: no obvious mass . Cardiovascular: S1 S2 normal no gallop . Respiratory: No rhonchi no rales . Abdomen: soft . Skin: no rash seen on limited exam . Musculoskeletal: not rigid . Psychiatric:unable to assess . Neurologic: no seizure no involuntary movements         Lab Data:   Basic Metabolic Panel: Recent Labs  Lab 03/31/20 0627 04/05/20 0431  NA 142 143  K 4.3 3.9  CL 103 106  CO2 28 27  GLUCOSE 64* 57*  BUN 16 13  CREATININE 0.66 0.61  CALCIUM 8.6* 8.6*  MG 2.1 2.0  PHOS 3.3  --     ABG: No results for input(s): PHART, PCO2ART, PO2ART, HCO3, O2SAT in the last 168 hours.  Liver Function Tests: No results for input(s): AST, ALT, ALKPHOS, BILITOT, PROT, ALBUMIN in the last 168 hours. No results for input(s): LIPASE, AMYLASE in the last 168 hours. No results for input(s): AMMONIA in the last 168 hours.  CBC: Recent Labs  Lab 03/31/20 0627 04/05/20 0431  WBC 13.0* 10.6*  HGB 9.9* 9.2*  HCT 31.9* 28.3*  MCV 93.0 93.7  PLT 329 299    Cardiac Enzymes: No results for input(s): CKTOTAL, CKMB, CKMBINDEX, TROPONINI in the last 168 hours.  BNP (last 3 results) No results for input(s): BNP in the last 8760  hours.  ProBNP (last 3 results) No results for input(s): PROBNP in the last 8760 hours.  Radiological Exams: No results found.  Assessment/Plan Active Problems:   Acute on chronic respiratory failure with hypoxia (HCC)   COVID-19 virus infection   Acute respiratory distress syndrome (ARDS) (HCC)   Pneumonia due to COVID-19 virus   Chronic obstructive asthma (with obstructive pulmonary disease) (HCC)   History of lung cancer   1. Acute on chronic respiratory failure hypoxia we will continue with Oxymizer titrate oxygen continue pulmonary toilet. 2. Chronic virus infection recovery 3. ARDS.  Slow improvement 4. Pneumonia due to COVID-19 treated we will continue to follow 5. Chronic obstructive asthma at baseline 6. History of lung cancer status post resection   I have personally seen and evaluated the patient, evaluated laboratory and imaging results, formulated the assessment and plan and placed orders. The Patient requires high complexity decision making with multiple systems involvement.  Rounds were done with the Respiratory Therapy Director and Staff therapists and discussed with nursing staff also.  Yevonne Pax, MD [facility] Pulmonary Critical Care Medicine Sleep Medicine

## 2020-04-06 DIAGNOSIS — J9621 Acute and chronic respiratory failure with hypoxia: Secondary | ICD-10-CM | POA: Diagnosis not present

## 2020-04-06 DIAGNOSIS — J80 Acute respiratory distress syndrome: Secondary | ICD-10-CM | POA: Diagnosis not present

## 2020-04-06 DIAGNOSIS — U071 COVID-19: Secondary | ICD-10-CM | POA: Diagnosis not present

## 2020-04-06 DIAGNOSIS — J449 Chronic obstructive pulmonary disease, unspecified: Secondary | ICD-10-CM | POA: Diagnosis not present

## 2020-04-06 NOTE — Progress Notes (Signed)
Pulmonary Critical Care Medicine [facility] [place]   PULMONARY CRITICAL CARE SERVICE  PROGRESS NOTE  Date of Service: 04/06/2020  Jaclyn Campbell  [id number]  DOB: 11-30-1955   DOA: 03/28/2020  Referring Physician: Carron Curie, MD  [name] is a 64 y.o. female seen for follow up of Acute on Chronic Respiratory Failure.  Patient currently is on 5 L of Oxymizer appears to be doing well with her with saturations of 92%.  Using the nebulizers as necessary.  Still she has a lot of anxiety issues going on.  Medications: Reviewed on Rounds  Physical Exam:  Vitals: Temperature is 97.9 pulse ninety-one respiratory twenty-two blood pressure is 154/90 saturations 98%  Ventilator Settings off the ventilator on 5 L Oxymizer  . General: Comfortable at this time . Eyes: Grossly normal lids, irises & conjunctiva . ENT: grossly tongue is normal . Neck: no obvious mass . Cardiovascular: S1 S2 normal no gallop . Respiratory: No rhonchi very coarse breath sounds are noted . Abdomen: soft . Skin: no rash seen on limited exam . Musculoskeletal: not rigid . Psychiatric:unable to assess . Neurologic: no seizure no involuntary movements         Lab Data:   Basic Metabolic Panel: Recent Labs  Lab 03/31/20 0627 04/05/20 0431  NA 142 143  K 4.3 3.9  CL 103 106  CO2 28 27  GLUCOSE 64* 57*  BUN 16 13  CREATININE 0.66 0.61  CALCIUM 8.6* 8.6*  MG 2.1 2.0  PHOS 3.3  --     ABG: No results for input(s): PHART, PCO2ART, PO2ART, HCO3, O2SAT in the last 168 hours.  Liver Function Tests: No results for input(s): AST, ALT, ALKPHOS, BILITOT, PROT, ALBUMIN in the last 168 hours. No results for input(s): LIPASE, AMYLASE in the last 168 hours. No results for input(s): AMMONIA in the last 168 hours.  CBC: Recent Labs  Lab 03/31/20 0627 04/05/20 0431  WBC 13.0* 10.6*  HGB 9.9* 9.2*  HCT 31.9* 28.3*  MCV 93.0 93.7  PLT 329 299    Cardiac Enzymes: No results for  input(s): CKTOTAL, CKMB, CKMBINDEX, TROPONINI in the last 168 hours.  BNP (last 3 results) No results for input(s): BNP in the last 8760 hours.  ProBNP (last 3 results) No results for input(s): PROBNP in the last 8760 hours.  Radiological Exams: No results found.  Assessment/Plan Active Problems:   Acute on chronic respiratory failure with hypoxia (HCC)   COVID-19 virus infection   Acute respiratory distress syndrome (ARDS) (HCC)   Pneumonia due to COVID-19 virus   Chronic obstructive asthma (with obstructive pulmonary disease) (HCC)   History of lung cancer   1. Acute on chronic respiratory failure hypoxia we'll continue with Oxymizer titrate oxygen continue pulmonary toilet.  Anxiety issues seem to be playing a part in her currently active oxygen as well 2. COVID-19 virus infection recovery phase 3. ARDS treated slowly improving 4. Pneumonia due to COVID-19 treated with antibiotics 5. Chronic asthma she needs to be continued on her nebulizers. 6. History of lung cancer status post resection   I have personally seen and evaluated the patient, evaluated laboratory and imaging results, formulated the assessment and plan and placed orders. The Patient requires high complexity decision making with multiple systems involvement.  Rounds were done with the Respiratory Therapy Director and Staff therapists and discussed with nursing staff also.  Yevonne Pax, MD [facility] Pulmonary Critical Care Medicine Sleep Medicine

## 2020-04-07 ENCOUNTER — Other Ambulatory Visit (HOSPITAL_COMMUNITY): Payer: Medicare HMO

## 2020-04-07 DIAGNOSIS — J449 Chronic obstructive pulmonary disease, unspecified: Secondary | ICD-10-CM | POA: Diagnosis not present

## 2020-04-07 DIAGNOSIS — U071 COVID-19: Secondary | ICD-10-CM | POA: Diagnosis not present

## 2020-04-07 DIAGNOSIS — J9621 Acute and chronic respiratory failure with hypoxia: Secondary | ICD-10-CM | POA: Diagnosis not present

## 2020-04-07 DIAGNOSIS — J80 Acute respiratory distress syndrome: Secondary | ICD-10-CM | POA: Diagnosis not present

## 2020-04-07 NOTE — Progress Notes (Signed)
Pulmonary Critical Care Medicine [facility] [place]   PULMONARY CRITICAL CARE SERVICE  PROGRESS NOTE  Date of Service: 04/07/2020  Jaclyn Campbell  [id number]  DOB: 03-08-1956   DOA: 03/28/2020  Referring Physician: Carron Curie, MD  [name] is a 65 y.o. female seen for follow up of Acute on Chronic Respiratory Failure.  Currently is on 5 L of oxygen she still has quite a lot of anxiety.  Spoke to her explained to her that she is doing better she appeared to calm down significantly after I were checked.  Right now is on Oxymizer  Medications: Reviewed on Rounds  Physical Exam:  Vitals: Temperature is 97.6 pulse 93 respiratory rate is 15 blood pressure is 168/91 saturations 98%  Ventilator Settings on 5 L Oxymizer  . General: Comfortable at this time . Eyes: Grossly normal lids, irises & conjunctiva . ENT: grossly tongue is normal . Neck: no obvious mass . Cardiovascular: S1 S2 normal no gallop . Respiratory: No rhonchi coarse breath sounds . Abdomen: soft . Skin: no rash seen on limited exam . Musculoskeletal: not rigid . Psychiatric:unable to assess . Neurologic: no seizure no involuntary movements         Lab Data:   Basic Metabolic Panel: Recent Labs  Lab 04/05/20 0431  NA 143  K 3.9  CL 106  CO2 27  GLUCOSE 57*  BUN 13  CREATININE 0.61  CALCIUM 8.6*  MG 2.0    ABG: No results for input(s): PHART, PCO2ART, PO2ART, HCO3, O2SAT in the last 168 hours.  Liver Function Tests: No results for input(s): AST, ALT, ALKPHOS, BILITOT, PROT, ALBUMIN in the last 168 hours. No results for input(s): LIPASE, AMYLASE in the last 168 hours. No results for input(s): AMMONIA in the last 168 hours.  CBC: Recent Labs  Lab 04/05/20 0431  WBC 10.6*  HGB 9.2*  HCT 28.3*  MCV 93.7  PLT 299    Cardiac Enzymes: No results for input(s): CKTOTAL, CKMB, CKMBINDEX, TROPONINI in the last 168 hours.  BNP (last 3 results) No results for input(s): BNP  in the last 8760 hours.  ProBNP (last 3 results) No results for input(s): PROBNP in the last 8760 hours.  Radiological Exams: No results found.  Assessment/Plan Active Problems:   Acute on chronic respiratory failure with hypoxia (HCC)   COVID-19 virus infection   Acute respiratory distress syndrome (ARDS) (HCC)   Pneumonia due to COVID-19 virus   Chronic obstructive asthma (with obstructive pulmonary disease) (HCC)   History of lung cancer   1. Acute on chronic respiratory failure hypoxia we will continue to titrate the oxygen as tolerated once again is noting her anxiety does appear to contribute to her increased work of breathing and she does quite a bit better once she is calmed down 2. COVID-19 virus infection in recovery 3. ARDS treated 4. Pneumonia due to COVID-19 slow improvement 5. History of lung cancer status post resection 6. Chronic management supportive care nebulizers as needed   I have personally seen and evaluated the patient, evaluated laboratory and imaging results, formulated the assessment and plan and placed orders. The Patient requires high complexity decision making with multiple systems involvement.  Rounds were done with the Respiratory Therapy Director and Staff therapists and discussed with nursing staff also.  Yevonne Pax, MD [facility] Pulmonary Critical Care Medicine Sleep Medicine

## 2020-04-08 DIAGNOSIS — J80 Acute respiratory distress syndrome: Secondary | ICD-10-CM | POA: Diagnosis not present

## 2020-04-08 DIAGNOSIS — J449 Chronic obstructive pulmonary disease, unspecified: Secondary | ICD-10-CM | POA: Diagnosis not present

## 2020-04-08 DIAGNOSIS — J9621 Acute and chronic respiratory failure with hypoxia: Secondary | ICD-10-CM | POA: Diagnosis not present

## 2020-04-08 DIAGNOSIS — U071 COVID-19: Secondary | ICD-10-CM | POA: Diagnosis not present

## 2020-04-08 NOTE — Progress Notes (Signed)
Pulmonary Critical Care Medicine [facility] [place]   PULMONARY CRITICAL CARE SERVICE  PROGRESS NOTE  Date of Service: 04/08/2020  Jaclyn Campbell  [id number]  DOB: 05/11/1955   DOA: 03/28/2020  Referring Physician: Carron Curie, MD  [name] is a 64 y.o. female seen for follow up of Acute on Chronic Respiratory Failure.  On 5 L of oxygen currently with Oxymizer.  She still will be better still has issues with severe anxiety  Medications: Reviewed on Rounds  Physical Exam:  Vitals: Temperature is 97.3 pulse 87 respiratory 18 blood pressure is 131/88 saturations 97%  Ventilator Settings on 5 L Oxymizer  . General: Comfortable at this time . Eyes: Grossly normal lids, irises & conjunctiva . ENT: grossly tongue is normal . Neck: no obvious mass . Cardiovascular: S1 S2 normal no gallop . Respiratory: No rhonchi no rales are noted at this time . Abdomen: soft . Skin: no rash seen on limited exam . Musculoskeletal: not rigid . Psychiatric:unable to assess . Neurologic: no seizure no involuntary movements         Lab Data:   Basic Metabolic Panel: Recent Labs  Lab 04/05/20 0431  NA 143  K 3.9  CL 106  CO2 27  GLUCOSE 57*  BUN 13  CREATININE 0.61  CALCIUM 8.6*  MG 2.0    ABG: No results for input(s): PHART, PCO2ART, PO2ART, HCO3, O2SAT in the last 168 hours.  Liver Function Tests: No results for input(s): AST, ALT, ALKPHOS, BILITOT, PROT, ALBUMIN in the last 168 hours. No results for input(s): LIPASE, AMYLASE in the last 168 hours. No results for input(s): AMMONIA in the last 168 hours.  CBC: Recent Labs  Lab 04/05/20 0431  WBC 10.6*  HGB 9.2*  HCT 28.3*  MCV 93.7  PLT 299    Cardiac Enzymes: No results for input(s): CKTOTAL, CKMB, CKMBINDEX, TROPONINI in the last 168 hours.  BNP (last 3 results) No results for input(s): BNP in the last 8760 hours.  ProBNP (last 3 results) No results for input(s): PROBNP in the last 8760  hours.  Radiological Exams: DG Chest Port 1 View  Result Date: 04/07/2020 CLINICAL DATA:  Cough. EXAM: PORTABLE CHEST 1 VIEW COMPARISON:  Chest radiograph 03/28/2020. FINDINGS: Monitoring leads overlie the patient. Stable cardiac and mediastinal contours. Interval worsening diffuse bilateral airspace opacities right greater than left. Probable small bilateral pleural effusions. No pneumothorax. Probable postsurgical changes left fifth rib. Postsurgical changes upper mediastinum. IMPRESSION: Interval worsening diffuse bilateral airspace opacities right greater than left which may represent edema or infection. Electronically Signed   By: Annia Belt M.D.   On: 04/07/2020 10:55    Assessment/Plan Active Problems:   Acute on chronic respiratory failure with hypoxia (HCC)   COVID-19 virus infection   Acute respiratory distress syndrome (ARDS) (HCC)   Pneumonia due to COVID-19 virus   Chronic obstructive asthma (with obstructive pulmonary disease) (HCC)   History of lung cancer   1. Acute on chronic respiratory failure we will continue with 5 L Oxymizer we will continue supportive care. 2. COVID-19 virus infection recovery we will continue with supportive care 3. Acute respiratory distress at baseline 4. Pneumonia due to COVID-19 treated 5. Chronic obstructive asthma at baseline 6. History of lung cancer status post resection   I have personally seen and evaluated the patient, evaluated laboratory and imaging results, formulated the assessment and plan and placed orders. The Patient requires high complexity decision making with multiple systems involvement.  Rounds were done  with the Respiratory Therapy Director and Staff therapists and discussed with nursing staff also.  Allyne Gee, MD [facility] Pulmonary Critical Care Medicine Sleep Medicine

## 2020-04-09 DIAGNOSIS — J80 Acute respiratory distress syndrome: Secondary | ICD-10-CM | POA: Diagnosis not present

## 2020-04-09 DIAGNOSIS — J449 Chronic obstructive pulmonary disease, unspecified: Secondary | ICD-10-CM | POA: Diagnosis not present

## 2020-04-09 DIAGNOSIS — J9621 Acute and chronic respiratory failure with hypoxia: Secondary | ICD-10-CM | POA: Diagnosis not present

## 2020-04-09 DIAGNOSIS — U071 COVID-19: Secondary | ICD-10-CM | POA: Diagnosis not present

## 2020-04-09 LAB — CBC
HCT: 26.8 % — ABNORMAL LOW (ref 36.0–46.0)
Hemoglobin: 8.4 g/dL — ABNORMAL LOW (ref 12.0–15.0)
MCH: 30.3 pg (ref 26.0–34.0)
MCHC: 31.3 g/dL (ref 30.0–36.0)
MCV: 96.8 fL (ref 80.0–100.0)
Platelets: 318 10*3/uL (ref 150–400)
RBC: 2.77 MIL/uL — ABNORMAL LOW (ref 3.87–5.11)
RDW: 18.6 % — ABNORMAL HIGH (ref 11.5–15.5)
WBC: 11.2 10*3/uL — ABNORMAL HIGH (ref 4.0–10.5)
nRBC: 0 % (ref 0.0–0.2)

## 2020-04-09 LAB — BASIC METABOLIC PANEL
Anion gap: 8 (ref 5–15)
BUN: 18 mg/dL (ref 8–23)
CO2: 33 mmol/L — ABNORMAL HIGH (ref 22–32)
Calcium: 8.3 mg/dL — ABNORMAL LOW (ref 8.9–10.3)
Chloride: 102 mmol/L (ref 98–111)
Creatinine, Ser: 0.81 mg/dL (ref 0.44–1.00)
GFR, Estimated: >60 mL/min (ref >60)
Glucose, Bld: 102 mg/dL — ABNORMAL HIGH (ref 70–99)
Potassium: 3.7 mmol/L (ref 3.5–5.1)
Sodium: 143 mmol/L (ref 135–145)

## 2020-04-09 LAB — MAGNESIUM: Magnesium: 1.8 mg/dL (ref 1.7–2.4)

## 2020-04-09 NOTE — Progress Notes (Signed)
Pulmonary Critical Care Medicine [facility] [place]   PULMONARY CRITICAL CARE SERVICE  PROGRESS NOTE  Date of Service: 04/09/2020  Jaclyn Campbell  [id number]  DOB: 02-15-1956   DOA: 03/28/2020  Referring Physician: Carron Curie, MD  [name] is a 64 y.o. female seen for follow up of Acute on Chronic Respiratory Failure.  Patient currently is on 5 L Oxymizer seems to be stabilizing on current oxygen requirement  Medications: Reviewed on Rounds  Physical Exam:  Vitals: Temperature is 98.2 pulse 85 respiratory 22 blood pressure is 120/77 saturations 100%  Ventilator Settings on 5 L Oxymizer  . General: Comfortable at this time . Eyes: Grossly normal lids, irises & conjunctiva . ENT: grossly tongue is normal . Neck: no obvious mass . Cardiovascular: S1 S2 normal no gallop . Respiratory: No rhonchi or rales noted at this time . Abdomen: soft . Skin: no rash seen on limited exam . Musculoskeletal: not rigid . Psychiatric:unable to assess . Neurologic: no seizure no involuntary movements         Lab Data:   Basic Metabolic Panel: Recent Labs  Lab 04/05/20 0431 04/09/20 0340  NA 143 143  K 3.9 3.7  CL 106 102  CO2 27 33*  GLUCOSE 57* 102*  BUN 13 18  CREATININE 0.61 0.81  CALCIUM 8.6* 8.3*  MG 2.0 1.8    ABG: No results for input(s): PHART, PCO2ART, PO2ART, HCO3, O2SAT in the last 168 hours.  Liver Function Tests: No results for input(s): AST, ALT, ALKPHOS, BILITOT, PROT, ALBUMIN in the last 168 hours. No results for input(s): LIPASE, AMYLASE in the last 168 hours. No results for input(s): AMMONIA in the last 168 hours.  CBC: Recent Labs  Lab 04/05/20 0431 04/09/20 0340  WBC 10.6* 11.2*  HGB 9.2* 8.4*  HCT 28.3* 26.8*  MCV 93.7 96.8  PLT 299 318    Cardiac Enzymes: No results for input(s): CKTOTAL, CKMB, CKMBINDEX, TROPONINI in the last 168 hours.  BNP (last 3 results) No results for input(s): BNP in the last 8760  hours.  ProBNP (last 3 results) No results for input(s): PROBNP in the last 8760 hours.  Radiological Exams: DG Chest Port 1 View  Result Date: 04/07/2020 CLINICAL DATA:  Cough. EXAM: PORTABLE CHEST 1 VIEW COMPARISON:  Chest radiograph 03/28/2020. FINDINGS: Monitoring leads overlie the patient. Stable cardiac and mediastinal contours. Interval worsening diffuse bilateral airspace opacities right greater than left. Probable small bilateral pleural effusions. No pneumothorax. Probable postsurgical changes left fifth rib. Postsurgical changes upper mediastinum. IMPRESSION: Interval worsening diffuse bilateral airspace opacities right greater than left which may represent edema or infection. Electronically Signed   By: Annia Belt M.D.   On: 04/07/2020 10:55    Assessment/Plan Active Problems:   Acute on chronic respiratory failure with hypoxia (HCC)   COVID-19 virus infection   Acute respiratory distress syndrome (ARDS) (HCC)   Pneumonia due to COVID-19 virus   Chronic obstructive asthma (with obstructive pulmonary disease) (HCC)   History of lung cancer   1. Acute on chronic respiratory failure hypoxia we will continue with Oxymizer titrate as tolerated continue pulmonary toilet 2. COVID-19 virus infection in recovery phase 3. ARDS last chest x-ray showed worsening of opacities but clinically she is actually improving continue to follow up on x-rays diuretics as necessary. 4. Pneumonia due to COVID-19 has been treated 5. Chronic asthma at baseline 6. History of lung cancer status post resection   I have personally seen and evaluated the patient, evaluated  laboratory and imaging results, formulated the assessment and plan and placed orders. The Patient requires high complexity decision making with multiple systems involvement.  Rounds were done with the Respiratory Therapy Director and Staff therapists and discussed with nursing staff also.  Yevonne Pax, MD [facility] Pulmonary Critical  Care Medicine Sleep Medicine

## 2020-04-10 ENCOUNTER — Other Ambulatory Visit (HOSPITAL_COMMUNITY): Payer: Medicare HMO

## 2020-04-10 DIAGNOSIS — J9621 Acute and chronic respiratory failure with hypoxia: Secondary | ICD-10-CM | POA: Diagnosis not present

## 2020-04-10 DIAGNOSIS — U071 COVID-19: Secondary | ICD-10-CM | POA: Diagnosis not present

## 2020-04-10 DIAGNOSIS — J449 Chronic obstructive pulmonary disease, unspecified: Secondary | ICD-10-CM | POA: Diagnosis not present

## 2020-04-10 DIAGNOSIS — J80 Acute respiratory distress syndrome: Secondary | ICD-10-CM | POA: Diagnosis not present

## 2020-04-10 NOTE — Progress Notes (Signed)
Pulmonary Critical Care Medicine [facility] [place]   PULMONARY CRITICAL CARE SERVICE  PROGRESS NOTE  Date of Service: 04/10/2020  Jaclyn Campbell  [id number]  DOB: [birth year]   DOA: 03/28/2020  Referring Physician: Carron Curie, MD  [name] is a 64 y.o. female seen for follow up of Acute on Chronic Respiratory Failure. Patient is on Oxymizer on 5 L of oxygen good saturations.  Medications: Reviewed on Rounds  Physical Exam:  Vitals: Temperature is 98.2 pulse 84 respiratory rate 18 blood pressure is 142/77 saturations 98%  Ventilator Settings on 5 L Oxymizer  . General: Comfortable at this time . Eyes: Grossly normal lids, irises & conjunctiva . ENT: grossly tongue is normal . Neck: no obvious mass . Cardiovascular: S1 S2 normal no gallop . Respiratory: No rhonchi no rales are noted at this time . Abdomen: soft . Skin: no rash seen on limited exam . Musculoskeletal: not rigid . Psychiatric:unable to assess . Neurologic: no seizure no involuntary movements         Lab Data:   Basic Metabolic Panel: Recent Labs  Lab 04/05/20 0431 04/09/20 0340  NA 143 143  K 3.9 3.7  CL 106 102  CO2 27 33*  GLUCOSE 57* 102*  BUN 13 18  CREATININE 0.61 0.81  CALCIUM 8.6* 8.3*  MG 2.0 1.8    ABG: No results for input(s): PHART, PCO2ART, PO2ART, HCO3, O2SAT in the last 168 hours.  Liver Function Tests: No results for input(s): AST, ALT, ALKPHOS, BILITOT, PROT, ALBUMIN in the last 168 hours. No results for input(s): LIPASE, AMYLASE in the last 168 hours. No results for input(s): AMMONIA in the last 168 hours.  CBC: Recent Labs  Lab 04/05/20 0431 04/09/20 0340  WBC 10.6* 11.2*  HGB 9.2* 8.4*  HCT 28.3* 26.8*  MCV 93.7 96.8  PLT 299 318    Cardiac Enzymes: No results for input(s): CKTOTAL, CKMB, CKMBINDEX, TROPONINI in the last 168 hours.  BNP (last 3 results) No results for input(s): BNP in the last 8760 hours.  ProBNP (last 3  results) No results for input(s): PROBNP in the last 8760 hours.  Radiological Exams: DG CHEST PORT 1 VIEW  Result Date: 04/10/2020 CLINICAL DATA:  Pneumonia EXAM: PORTABLE CHEST 1 VIEW COMPARISON:  Three days ago FINDINGS: Low volume chest with extensive airspace disease. Postoperative chest and thoracic inlet. No visible effusion or pneumothorax. IMPRESSION: Stable low volume chest and extensive airspace disease. Electronically Signed   By: Marnee Spring M.D.   On: 04/10/2020 07:14    Assessment/Plan Active Problems:   Acute on chronic respiratory failure with hypoxia (HCC)   COVID-19 virus infection   Acute respiratory distress syndrome (ARDS) (HCC)   Pneumonia due to COVID-19 virus   Chronic obstructive asthma (with obstructive pulmonary disease) (HCC)   History of lung cancer   1. Acute on chronic respiratory failure hypoxia titrate oxygen down as tolerated continue pulmonary toilet supportive care. 2. COVID-19 virus infection recovery phase we'll continue to follow. 3. Acute respiratory distress no change 4. Pneumonia due to COVID-19 treated 5. Chronic asthma supportive care 6. History of lung cancer status post resection   I have personally seen and evaluated the patient, evaluated laboratory and imaging results, formulated the assessment and plan and placed orders. The Patient requires high complexity decision making with multiple systems involvement.  Rounds were done with the Respiratory Therapy Director and Staff therapists and discussed with nursing staff also.  Yevonne Pax, MD [facility] Pulmonary Critical  Care Medicine Sleep Medicine

## 2020-04-11 ENCOUNTER — Other Ambulatory Visit (HOSPITAL_COMMUNITY): Payer: Medicare HMO

## 2020-04-11 DIAGNOSIS — J80 Acute respiratory distress syndrome: Secondary | ICD-10-CM | POA: Diagnosis not present

## 2020-04-11 DIAGNOSIS — J9621 Acute and chronic respiratory failure with hypoxia: Secondary | ICD-10-CM | POA: Diagnosis not present

## 2020-04-11 DIAGNOSIS — J449 Chronic obstructive pulmonary disease, unspecified: Secondary | ICD-10-CM | POA: Diagnosis not present

## 2020-04-11 DIAGNOSIS — U071 COVID-19: Secondary | ICD-10-CM | POA: Diagnosis not present

## 2020-04-11 NOTE — Progress Notes (Signed)
Pulmonary Critical Care Medicine [facility] [place]   PULMONARY CRITICAL CARE SERVICE  PROGRESS NOTE  Date of Service: 04/11/2020  Jaclyn Campbell  [id number]  DOB: Apr 18, 1956   DOA: 03/28/2020  Referring Physician: Carron Curie, MD  [name] is a 64 y.o. female seen for follow up of Acute on Chronic Respiratory Failure.  She remains on 5 L Oxymizer received some bad news that her husband has passed away right now she does have increased anxiety noted which is being managed by the primary team  Medications: Reviewed on Rounds  Physical Exam:  Vitals: Temperature is 97.2 pulse 90 respiratory 23 blood pressure is 122/67 saturations 98%  Ventilator Settings on 5 L Oxymizer  . General: Comfortable at this time . Eyes: Grossly normal lids, irises & conjunctiva . ENT: grossly tongue is normal . Neck: no obvious mass . Cardiovascular: S1 S2 normal no gallop . Respiratory: Coarse rhonchi expansion equal . Abdomen: soft . Skin: no rash seen on limited exam . Musculoskeletal: not rigid . Psychiatric:unable to assess . Neurologic: no seizure no involuntary movements         Lab Data:   Basic Metabolic Panel: Recent Labs  Lab 04/05/20 0431 04/09/20 0340  NA 143 143  K 3.9 3.7  CL 106 102  CO2 27 33*  GLUCOSE 57* 102*  BUN 13 18  CREATININE 0.61 0.81  CALCIUM 8.6* 8.3*  MG 2.0 1.8    ABG: No results for input(s): PHART, PCO2ART, PO2ART, HCO3, O2SAT in the last 168 hours.  Liver Function Tests: No results for input(s): AST, ALT, ALKPHOS, BILITOT, PROT, ALBUMIN in the last 168 hours. No results for input(s): LIPASE, AMYLASE in the last 168 hours. No results for input(s): AMMONIA in the last 168 hours.  CBC: Recent Labs  Lab 04/05/20 0431 04/09/20 0340  WBC 10.6* 11.2*  HGB 9.2* 8.4*  HCT 28.3* 26.8*  MCV 93.7 96.8  PLT 299 318    Cardiac Enzymes: No results for input(s): CKTOTAL, CKMB, CKMBINDEX, TROPONINI in the last 168  hours.  BNP (last 3 results) No results for input(s): BNP in the last 8760 hours.  ProBNP (last 3 results) No results for input(s): PROBNP in the last 8760 hours.  Radiological Exams: DG CHEST PORT 1 VIEW  Result Date: 04/10/2020 CLINICAL DATA:  Pneumonia EXAM: PORTABLE CHEST 1 VIEW COMPARISON:  Three days ago FINDINGS: Low volume chest with extensive airspace disease. Postoperative chest and thoracic inlet. No visible effusion or pneumothorax. IMPRESSION: Stable low volume chest and extensive airspace disease. Electronically Signed   By: Marnee Spring M.D.   On: 04/10/2020 07:14    Assessment/Plan Active Problems:   Acute on chronic respiratory failure with hypoxia (HCC)   COVID-19 virus infection   Acute respiratory distress syndrome (ARDS) (HCC)   Pneumonia due to COVID-19 virus   Chronic obstructive asthma (with obstructive pulmonary disease) (HCC)   History of lung cancer   1. Acute on chronic respiratory failure hypoxia doing fine with a 5 L Oxymizer she is intermittently using nonrebreather when she gets more anxious 2. COVID-19 virus infection in recovery 3. ARDS slow improvement 4. COVID-19 pneumonia treated 5. Chronic asthma at baseline 6. Lung cancer status post resection   I have personally seen and evaluated the patient, evaluated laboratory and imaging results, formulated the assessment and plan and placed orders. The Patient requires high complexity decision making with multiple systems involvement.  Rounds were done with the Respiratory Therapy Director and Staff therapists and  discussed with nursing staff also.  Allyne Gee, MD [facility] Pulmonary Critical Care Medicine Sleep Medicine

## 2020-04-12 DIAGNOSIS — J9621 Acute and chronic respiratory failure with hypoxia: Secondary | ICD-10-CM | POA: Diagnosis not present

## 2020-04-12 DIAGNOSIS — U071 COVID-19: Secondary | ICD-10-CM | POA: Diagnosis not present

## 2020-04-12 DIAGNOSIS — J449 Chronic obstructive pulmonary disease, unspecified: Secondary | ICD-10-CM | POA: Diagnosis not present

## 2020-04-12 DIAGNOSIS — J80 Acute respiratory distress syndrome: Secondary | ICD-10-CM | POA: Diagnosis not present

## 2020-04-12 NOTE — Progress Notes (Signed)
Pulmonary Critical Care Medicine [facility] [place]   PULMONARY CRITICAL CARE SERVICE  PROGRESS NOTE  Date of Service: 04/12/2020  Colbie Sliker  [id number]  DOB: 1956/03/29   DOA: 03/28/2020  Referring Physician: Carron Curie, MD  [name] is a 64 y.o. female seen for follow up of Acute on Chronic Respiratory Failure.  She is down to 4 L Oxymizer looks like she is doing little bit better  Medications: Reviewed on Rounds  Physical Exam:  Vitals: Temperature is 98.6 pulse 90 respiratory 19 blood pressure is 147/81 saturations 100%  Ventilator Settings on 4 L Oxymizer   General: Comfortable at this time  Eyes: Grossly normal lids, irises & conjunctiva  ENT: grossly tongue is normal  Neck: no obvious mass  Cardiovascular: S1 S2 normal no gallop  Respiratory: No rhonchi very coarse breath sounds  Abdomen: soft  Skin: no rash seen on limited exam  Musculoskeletal: not rigid  Psychiatric:unable to assess  Neurologic: no seizure no involuntary movements         Lab Data:   Basic Metabolic Panel: Recent Labs  Lab 04/09/20 0340  NA 143  K 3.7  CL 102  CO2 33*  GLUCOSE 102*  BUN 18  CREATININE 0.81  CALCIUM 8.3*  MG 1.8    ABG: No results for input(s): PHART, PCO2ART, PO2ART, HCO3, O2SAT in the last 168 hours.  Liver Function Tests: No results for input(s): AST, ALT, ALKPHOS, BILITOT, PROT, ALBUMIN in the last 168 hours. No results for input(s): LIPASE, AMYLASE in the last 168 hours. No results for input(s): AMMONIA in the last 168 hours.  CBC: Recent Labs  Lab 04/09/20 0340  WBC 11.2*  HGB 8.4*  HCT 26.8*  MCV 96.8  PLT 318    Cardiac Enzymes: No results for input(s): CKTOTAL, CKMB, CKMBINDEX, TROPONINI in the last 168 hours.  BNP (last 3 results) No results for input(s): BNP in the last 8760 hours.  ProBNP (last 3 results) No results for input(s): PROBNP in the last 8760 hours.  Radiological Exams: DG CHEST  PORT 1 VIEW  Result Date: 04/11/2020 CLINICAL DATA:  Shortness of breath, cough, pleural effusion EXAM: PORTABLE CHEST 1 VIEW COMPARISON:  04/10/2020 FINDINGS: Diffuse bilateral opacities remain present probable small pleural effusions. No pneumothorax. Stable cardiomediastinal contours. IMPRESSION: Persistent bilateral pulmonary opacities and probable small bilateral pleural effusions. Electronically Signed   By: Guadlupe Spanish M.D.   On: 04/11/2020 08:46    Assessment/Plan Active Problems:   Acute on chronic respiratory failure with hypoxia (HCC)   COVID-19 virus infection   Acute respiratory distress syndrome (ARDS) (HCC)   Pneumonia due to COVID-19 virus   Chronic obstructive asthma (with obstructive pulmonary disease) (HCC)   History of lung cancer   1. Acute on chronic respiratory failure hypoxia we will continue with the Oxymizer titrate oxygen continue pulmonary toilet 2. COVID-19 virus infection recovery 3. ARDS treated improved 4. Pneumonia due to COVID-19 improving 5. Chronic asthma at baseline 6. History of lung cancer status post resection   I have personally seen and evaluated the patient, evaluated laboratory and imaging results, formulated the assessment and plan and placed orders. The Patient requires high complexity decision making with multiple systems involvement.  Rounds were done with the Respiratory Therapy Director and Staff therapists and discussed with nursing staff also.  Yevonne Pax, MD [facility] Pulmonary Critical Care Medicine Sleep Medicine

## 2020-04-13 ENCOUNTER — Other Ambulatory Visit (HOSPITAL_COMMUNITY): Payer: Medicare HMO

## 2020-04-13 DIAGNOSIS — J9621 Acute and chronic respiratory failure with hypoxia: Secondary | ICD-10-CM | POA: Diagnosis not present

## 2020-04-13 DIAGNOSIS — J80 Acute respiratory distress syndrome: Secondary | ICD-10-CM | POA: Diagnosis not present

## 2020-04-13 DIAGNOSIS — U071 COVID-19: Secondary | ICD-10-CM | POA: Diagnosis not present

## 2020-04-13 DIAGNOSIS — J449 Chronic obstructive pulmonary disease, unspecified: Secondary | ICD-10-CM | POA: Diagnosis not present

## 2020-04-13 NOTE — Progress Notes (Signed)
Pulmonary Critical Care Medicine [facility] [place]   PULMONARY CRITICAL CARE SERVICE  PROGRESS NOTE  Date of Service: 04/13/2020  Jaclyn Campbell  [id number]  DOB: 1955-04-27   DOA: 03/28/2020  Referring Physician: Carron Curie, MD  [name] is a 64 y.o. female seen for follow up of Acute on Chronic Respiratory Failure.  Patient is on 4 L Oxymizer she seems to be holding her own as far as saturations are concerned.  Still has issues with anxiety and occasionally is using the 100% nonrebreather mask  Medications: Reviewed on Rounds  Physical Exam:  Vitals: Temperature is 97.0 pulse 88 respiratory rate is 15 blood pressure 138/81 saturations 98%  Ventilator Settings on 4 L Oxymizer  . General: Comfortable at this time . Eyes: Grossly normal lids, irises & conjunctiva . ENT: grossly tongue is normal . Neck: no obvious mass . Cardiovascular: S1 S2 normal no gallop . Respiratory: No rhonchi no rales are noted at this time . Abdomen: soft . Skin: no rash seen on limited exam . Musculoskeletal: not rigid . Psychiatric:unable to assess . Neurologic: no seizure no involuntary movements         Lab Data:   Basic Metabolic Panel: Recent Labs  Lab 04/09/20 0340  NA 143  K 3.7  CL 102  CO2 33*  GLUCOSE 102*  BUN 18  CREATININE 0.81  CALCIUM 8.3*  MG 1.8    ABG: No results for input(s): PHART, PCO2ART, PO2ART, HCO3, O2SAT in the last 168 hours.  Liver Function Tests: No results for input(s): AST, ALT, ALKPHOS, BILITOT, PROT, ALBUMIN in the last 168 hours. No results for input(s): LIPASE, AMYLASE in the last 168 hours. No results for input(s): AMMONIA in the last 168 hours.  CBC: Recent Labs  Lab 04/09/20 0340  WBC 11.2*  HGB 8.4*  HCT 26.8*  MCV 96.8  PLT 318    Cardiac Enzymes: No results for input(s): CKTOTAL, CKMB, CKMBINDEX, TROPONINI in the last 168 hours.  BNP (last 3 results) No results for input(s): BNP in the last 8760  hours.  ProBNP (last 3 results) No results for input(s): PROBNP in the last 8760 hours.  Radiological Exams: No results found.  Assessment/Plan Active Problems:   Acute on chronic respiratory failure with hypoxia (HCC)   COVID-19 virus infection   Acute respiratory distress syndrome (ARDS) (HCC)   Pneumonia due to COVID-19 virus   Chronic obstructive asthma (with obstructive pulmonary disease) (HCC)   History of lung cancer   1. Acute on chronic respiratory failure with oxygen continue with Oxymizer titrate as tolerated. 2. COVID-19 virus infection in resolution 3. ARDS treated 4. COVID-19 pneumonia treated improving 5. COPD with asthma nebulizers as necessary 6. History of lung cancer status post resection   I have personally seen and evaluated the patient, evaluated laboratory and imaging results, formulated the assessment and plan and placed orders. The Patient requires high complexity decision making with multiple systems involvement.  Rounds were done with the Respiratory Therapy Director and Staff therapists and discussed with nursing staff also.  Yevonne Pax, MD [facility] Pulmonary Critical Care Medicine Sleep Medicine

## 2020-04-14 DIAGNOSIS — U071 COVID-19: Secondary | ICD-10-CM | POA: Diagnosis not present

## 2020-04-14 DIAGNOSIS — J449 Chronic obstructive pulmonary disease, unspecified: Secondary | ICD-10-CM | POA: Diagnosis not present

## 2020-04-14 DIAGNOSIS — J80 Acute respiratory distress syndrome: Secondary | ICD-10-CM | POA: Diagnosis not present

## 2020-04-14 DIAGNOSIS — J9621 Acute and chronic respiratory failure with hypoxia: Secondary | ICD-10-CM | POA: Diagnosis not present

## 2020-04-14 LAB — BASIC METABOLIC PANEL
Anion gap: 9 (ref 5–15)
BUN: 15 mg/dL (ref 8–23)
CO2: 35 mmol/L — ABNORMAL HIGH (ref 22–32)
Calcium: 8.4 mg/dL — ABNORMAL LOW (ref 8.9–10.3)
Chloride: 95 mmol/L — ABNORMAL LOW (ref 98–111)
Creatinine, Ser: 0.84 mg/dL (ref 0.44–1.00)
GFR, Estimated: >60 mL/min (ref >60)
Glucose, Bld: 91 mg/dL (ref 70–99)
Potassium: 3.1 mmol/L — ABNORMAL LOW (ref 3.5–5.1)
Sodium: 139 mmol/L (ref 135–145)

## 2020-04-14 LAB — CBC
HCT: 30 % — ABNORMAL LOW (ref 36.0–46.0)
Hemoglobin: 9.3 g/dL — ABNORMAL LOW (ref 12.0–15.0)
MCH: 30.1 pg (ref 26.0–34.0)
MCHC: 31 g/dL (ref 30.0–36.0)
MCV: 97.1 fL (ref 80.0–100.0)
Platelets: 356 10*3/uL (ref 150–400)
RBC: 3.09 MIL/uL — ABNORMAL LOW (ref 3.87–5.11)
RDW: 18.3 % — ABNORMAL HIGH (ref 11.5–15.5)
WBC: 10.9 10*3/uL — ABNORMAL HIGH (ref 4.0–10.5)
nRBC: 0 % (ref 0.0–0.2)

## 2020-04-14 NOTE — Progress Notes (Signed)
Pulmonary Critical Care Medicine [facility] [place]   PULMONARY CRITICAL CARE SERVICE  PROGRESS NOTE  Date of Service: 04/14/2020  Jaclyn Campbell  [id number]  DOB: 01-01-1956   DOA: 03/28/2020  Referring Physician: Carron Curie, MD  [name] is a 64 y.o. female seen for follow up of Acute on Chronic Respiratory Failure.  Patient currently is on 4 L Oxymizer.  Good saturations are noted.  Still has a lot of anxiety issues going on  Medications: Reviewed on Rounds  Physical Exam:  Vitals: Temperature 97.2 pulse 93 respiratory 25 blood pressure is 117/70 saturations 97%  Ventilator Settings 4 L Oxymizer  . General: Comfortable at this time . Eyes: Grossly normal lids, irises & conjunctiva . ENT: grossly tongue is normal . Neck: no obvious mass . Cardiovascular: S1 S2 normal no gallop . Respiratory: No rhonchi very coarse breath sounds . Abdomen: soft . Skin: no rash seen on limited exam . Musculoskeletal: not rigid . Psychiatric:unable to assess . Neurologic: no seizure no involuntary movements         Lab Data:   Basic Metabolic Panel: Recent Labs  Lab 04/09/20 0340 04/14/20 0542  NA 143 139  K 3.7 3.1*  CL 102 95*  CO2 33* 35*  GLUCOSE 102* 91  BUN 18 15  CREATININE 0.81 0.84  CALCIUM 8.3* 8.4*  MG 1.8  --     ABG: No results for input(s): PHART, PCO2ART, PO2ART, HCO3, O2SAT in the last 168 hours.  Liver Function Tests: No results for input(s): AST, ALT, ALKPHOS, BILITOT, PROT, ALBUMIN in the last 168 hours. No results for input(s): LIPASE, AMYLASE in the last 168 hours. No results for input(s): AMMONIA in the last 168 hours.  CBC: Recent Labs  Lab 04/09/20 0340 04/14/20 0542  WBC 11.2* 10.9*  HGB 8.4* 9.3*  HCT 26.8* 30.0*  MCV 96.8 97.1  PLT 318 356    Cardiac Enzymes: No results for input(s): CKTOTAL, CKMB, CKMBINDEX, TROPONINI in the last 168 hours.  BNP (last 3 results) No results for input(s): BNP in the last  8760 hours.  ProBNP (last 3 results) No results for input(s): PROBNP in the last 8760 hours.  Radiological Exams: DG CHEST PORT 1 VIEW  Result Date: 04/13/2020 CLINICAL DATA:  COVID-19 positive EXAM: PORTABLE CHEST 1 VIEW COMPARISON:  04/11/2020 FINDINGS: Stable cardiomediastinal contours. Atherosclerotic calcification of the aortic knob. Slightly improved aeration of the bilateral lung fields with persistent diffuse interstitial opacities. Possible small bilateral pleural effusions. No pneumothorax. IMPRESSION: Slightly improved aeration of the bilateral lung fields with persistent diffuse interstitial opacities. Electronically Signed   By: Duanne Guess D.O.   On: 04/13/2020 10:48    Assessment/Plan Active Problems:   Acute on chronic respiratory failure with hypoxia (HCC)   COVID-19 virus infection   Acute respiratory distress syndrome (ARDS) (HCC)   Pneumonia due to COVID-19 virus   Chronic obstructive asthma (with obstructive pulmonary disease) (HCC)   History of lung cancer   1. Acute on chronic respiratory failure with hypoxia we will continue with Oxymizer titrate oxygen as tolerated. 2. COVID-19 virus infection in recovery we will continue to follow 3. ARDS treated 4. Pneumonia due to COVID-19 treated 5. COPD we will continue to monitor patient appears to be at baseline   I have personally seen and evaluated the patient, evaluated laboratory and imaging results, formulated the assessment and plan and placed orders. The Patient requires high complexity decision making with multiple systems involvement.  Rounds were done  with the Respiratory Therapy Director and Staff therapists and discussed with nursing staff also.  Allyne Gee, MD [facility] Pulmonary Critical Care Medicine Sleep Medicine

## 2020-04-15 ENCOUNTER — Other Ambulatory Visit (HOSPITAL_COMMUNITY): Payer: Medicare HMO

## 2020-04-15 DIAGNOSIS — J9621 Acute and chronic respiratory failure with hypoxia: Secondary | ICD-10-CM | POA: Diagnosis not present

## 2020-04-15 DIAGNOSIS — J449 Chronic obstructive pulmonary disease, unspecified: Secondary | ICD-10-CM | POA: Diagnosis not present

## 2020-04-15 DIAGNOSIS — J80 Acute respiratory distress syndrome: Secondary | ICD-10-CM | POA: Diagnosis not present

## 2020-04-15 DIAGNOSIS — U071 COVID-19: Secondary | ICD-10-CM | POA: Diagnosis not present

## 2020-04-15 LAB — BASIC METABOLIC PANEL
Anion gap: 8 (ref 5–15)
BUN: 16 mg/dL (ref 8–23)
CO2: 34 mmol/L — ABNORMAL HIGH (ref 22–32)
Calcium: 8.8 mg/dL — ABNORMAL LOW (ref 8.9–10.3)
Chloride: 95 mmol/L — ABNORMAL LOW (ref 98–111)
Creatinine, Ser: 0.82 mg/dL (ref 0.44–1.00)
GFR, Estimated: >60 mL/min (ref >60)
Glucose, Bld: 99 mg/dL (ref 70–99)
Potassium: 3.7 mmol/L (ref 3.5–5.1)
Sodium: 137 mmol/L (ref 135–145)

## 2020-04-15 LAB — CBC
HCT: 27.9 % — ABNORMAL LOW (ref 36.0–46.0)
Hemoglobin: 9.1 g/dL — ABNORMAL LOW (ref 12.0–15.0)
MCH: 31.3 pg (ref 26.0–34.0)
MCHC: 32.6 g/dL (ref 30.0–36.0)
MCV: 95.9 fL (ref 80.0–100.0)
Platelets: 395 10*3/uL (ref 150–400)
RBC: 2.91 MIL/uL — ABNORMAL LOW (ref 3.87–5.11)
RDW: 18.4 % — ABNORMAL HIGH (ref 11.5–15.5)
WBC: 9.5 10*3/uL (ref 4.0–10.5)
nRBC: 0 % (ref 0.0–0.2)

## 2020-04-15 NOTE — Progress Notes (Signed)
Pulmonary Critical Care Medicine [facility] [place]   PULMONARY CRITICAL CARE SERVICE  PROGRESS NOTE  Date of Service: 04/15/2020  Jaclyn Campbell  UXN:[id number]  DOB: Apr 04, 1956   DOA: 03/28/2020  Referring Physician: Carron Curie, MD  [name] is a 64 y.o. female seen for follow up of Acute on Chronic Respiratory Failure.  Patient is on 4 L Oxymizer comfortable right now without distress on nasal cannula  Medications: Reviewed on Rounds  Physical Exam:  Vitals: Temperature 97.0 pulse 90 respiratory 19 blood pressure is 125/78 saturations 99%  Ventilator Settings on 4 L Oxymizer  . General: Comfortable at this time . Eyes: Grossly normal lids, irises & conjunctiva . ENT: grossly tongue is normal . Neck: no obvious mass . Cardiovascular: S1 S2 normal no gallop . Respiratory: No rhonchi no rales are noted at this time . Abdomen: soft . Skin: no rash seen on limited exam . Musculoskeletal: not rigid . Psychiatric:unable to assess . Neurologic: no seizure no involuntary movements         Lab Data:   Basic Metabolic Panel: Recent Labs  Lab 04/09/20 0340 04/14/20 0542 04/15/20 0555  NA 143 139 137  K 3.7 3.1* 3.7  CL 102 95* 95*  CO2 33* 35* 34*  GLUCOSE 102* 91 99  BUN 18 15 16   CREATININE 0.81 0.84 0.82  CALCIUM 8.3* 8.4* 8.8*  MG 1.8  --   --     ABG: No results for input(s): PHART, PCO2ART, PO2ART, HCO3, O2SAT in the last 168 hours.  Liver Function Tests: No results for input(s): AST, ALT, ALKPHOS, BILITOT, PROT, ALBUMIN in the last 168 hours. No results for input(s): LIPASE, AMYLASE in the last 168 hours. No results for input(s): AMMONIA in the last 168 hours.  CBC: Recent Labs  Lab 04/09/20 0340 04/14/20 0542 04/15/20 0555  WBC 11.2* 10.9* 9.5  HGB 8.4* 9.3* 9.1*  HCT 26.8* 30.0* 27.9*  MCV 96.8 97.1 95.9  PLT 318 356 395    Cardiac Enzymes: No results for input(s): CKTOTAL, CKMB, CKMBINDEX, TROPONINI in the last 168  hours.  BNP (last 3 results) No results for input(s): BNP in the last 8760 hours.  ProBNP (last 3 results) No results for input(s): PROBNP in the last 8760 hours.  Radiological Exams: DG Chest 1 View  Result Date: 04/15/2020 CLINICAL DATA:  64 year old female with COVID-19. Respiratory failure. EXAM: CHEST  1 VIEW COMPARISON:  Portable chest 04/13/2020 and earlier. FINDINGS: Portable AP semi upright view at 0616 hours. Mildly improved lung volumes. Stable cardiac size and mediastinal contours. Numerous surgical clips along the bilateral thoracic inlet and left heart border are stable. Patchy and coarse bilateral pulmonary opacity with moderately improved ventilation compared to 04/11/2020. Possible small left pleural effusion. No pneumothorax or areas of worsening ventilation from 2 days ago. Negative visible bowel gas pattern, osseous structures. IMPRESSION: 1. Bilateral pneumonia with improved ventilation from 04/11/2020. Possible small left pleural effusion. 2. No new cardiopulmonary abnormality. Electronically Signed   By: 04/13/2020 M.D.   On: 04/15/2020 06:41   DG CHEST PORT 1 VIEW  Result Date: 04/13/2020 CLINICAL DATA:  COVID-19 positive EXAM: PORTABLE CHEST 1 VIEW COMPARISON:  04/11/2020 FINDINGS: Stable cardiomediastinal contours. Atherosclerotic calcification of the aortic knob. Slightly improved aeration of the bilateral lung fields with persistent diffuse interstitial opacities. Possible small bilateral pleural effusions. No pneumothorax. IMPRESSION: Slightly improved aeration of the bilateral lung fields with persistent diffuse interstitial opacities. Electronically Signed   By: 04/13/2020  Plundo D.O.   On: 04/13/2020 10:48    Assessment/Plan Active Problems:   Acute on chronic respiratory failure with hypoxia (HCC)   COVID-19 virus infection   Acute respiratory distress syndrome (ARDS) (HCC)   Pneumonia due to COVID-19 virus   Chronic obstructive asthma (with obstructive  pulmonary disease) (HCC)   History of lung cancer   1. Acute on chronic respiratory failure with hypoxia we will continue with Oxymizer titrate down as tolerated. 2. COVID-19 virus infection in recovery 3. ARDS improving 4. Pneumonia due to COVID-19 treated 5. Chronic asthma nebulizers 6. History of lung cancer status post recent   I have personally seen and evaluated the patient, evaluated laboratory and imaging results, formulated the assessment and plan and placed orders. The Patient requires high complexity decision making with multiple systems involvement.  Rounds were done with the Respiratory Therapy Director and Staff therapists and discussed with nursing staff also.  Yevonne Pax, MD [facility] Pulmonary Critical Care Medicine Sleep Medicine

## 2020-04-16 DIAGNOSIS — J9621 Acute and chronic respiratory failure with hypoxia: Secondary | ICD-10-CM | POA: Diagnosis not present

## 2020-04-16 DIAGNOSIS — J80 Acute respiratory distress syndrome: Secondary | ICD-10-CM | POA: Diagnosis not present

## 2020-04-16 DIAGNOSIS — J449 Chronic obstructive pulmonary disease, unspecified: Secondary | ICD-10-CM | POA: Diagnosis not present

## 2020-04-16 DIAGNOSIS — U071 COVID-19: Secondary | ICD-10-CM | POA: Diagnosis not present

## 2020-04-16 NOTE — Progress Notes (Signed)
Pulmonary Critical Care Medicine [facility] [place]   PULMONARY CRITICAL CARE SERVICE  PROGRESS NOTE  Date of Service: 04/16/2020  Jaclyn Campbell  [id number]  DOB: Nov 17, 1955   DOA: 03/28/2020  Referring Physician: Carron Curie, MD  [name] is a 64 y.o. female seen for follow up of Acute on Chronic Respiratory Failure.  Patient remains on nasal cannula right now off of the Oxymizer currently is on 5 L  Medications: Reviewed on Rounds  Physical Exam:  Vitals: Temperature is 97.0 pulse 86 respiratory 21 blood pressure is 133/76 saturations 100%  Ventilator Settings on 5 L nasal cannula  . General: Comfortable at this time . Eyes: Grossly normal lids, irises & conjunctiva . ENT: grossly tongue is normal . Neck: no obvious mass . Cardiovascular: S1 S2 normal no gallop . Respiratory: No rhonchi no rales noted at this time . Abdomen: soft . Skin: no rash seen on limited exam . Musculoskeletal: not rigid . Psychiatric:unable to assess . Neurologic: no seizure no involuntary movements         Lab Data:   Basic Metabolic Panel: Recent Labs  Lab 04/14/20 0542 04/15/20 0555  NA 139 137  K 3.1* 3.7  CL 95* 95*  CO2 35* 34*  GLUCOSE 91 99  BUN 15 16  CREATININE 0.84 0.82  CALCIUM 8.4* 8.8*    ABG: No results for input(s): PHART, PCO2ART, PO2ART, HCO3, O2SAT in the last 168 hours.  Liver Function Tests: No results for input(s): AST, ALT, ALKPHOS, BILITOT, PROT, ALBUMIN in the last 168 hours. No results for input(s): LIPASE, AMYLASE in the last 168 hours. No results for input(s): AMMONIA in the last 168 hours.  CBC: Recent Labs  Lab 04/14/20 0542 04/15/20 0555  WBC 10.9* 9.5  HGB 9.3* 9.1*  HCT 30.0* 27.9*  MCV 97.1 95.9  PLT 356 395    Cardiac Enzymes: No results for input(s): CKTOTAL, CKMB, CKMBINDEX, TROPONINI in the last 168 hours.  BNP (last 3 results) No results for input(s): BNP in the last 8760 hours.  ProBNP (last 3  results) No results for input(s): PROBNP in the last 8760 hours.  Radiological Exams: DG Chest 1 View  Result Date: 04/15/2020 CLINICAL DATA:  64 year old female with COVID-19. Respiratory failure. EXAM: CHEST  1 VIEW COMPARISON:  Portable chest 04/13/2020 and earlier. FINDINGS: Portable AP semi upright view at 0616 hours. Mildly improved lung volumes. Stable cardiac size and mediastinal contours. Numerous surgical clips along the bilateral thoracic inlet and left heart border are stable. Patchy and coarse bilateral pulmonary opacity with moderately improved ventilation compared to 04/11/2020. Possible small left pleural effusion. No pneumothorax or areas of worsening ventilation from 2 days ago. Negative visible bowel gas pattern, osseous structures. IMPRESSION: 1. Bilateral pneumonia with improved ventilation from 04/11/2020. Possible small left pleural effusion. 2. No new cardiopulmonary abnormality. Electronically Signed   By: Odessa Fleming M.D.   On: 04/15/2020 06:41    Assessment/Plan Active Problems:   Acute on chronic respiratory failure with hypoxia (HCC)   COVID-19 virus infection   Acute respiratory distress syndrome (ARDS) (HCC)   Pneumonia due to COVID-19 virus   Chronic obstructive asthma (with obstructive pulmonary disease) (HCC)   History of lung cancer   1. Acute on chronic respiratory failure with hypoxia we will continue with nasal cannula titrate oxygen continue pulmonary toilet. 2. COVID-19 virus infection recovery phase we will continue to monitor 3. Acute respiratory distress no change 4. Pneumonia due to COVID-19 treated 5. Chronic  obstructive asthma at baseline 6. History of lung cancer no change we will continue to follow   I have personally seen and evaluated the patient, evaluated laboratory and imaging results, formulated the assessment and plan and placed orders. The Patient requires high complexity decision making with multiple systems involvement.  Rounds were  done with the Respiratory Therapy Director and Staff therapists and discussed with nursing staff also.  Yevonne Pax, MD [facility] Pulmonary Critical Care Medicine Sleep Medicine

## 2020-04-17 DIAGNOSIS — U071 COVID-19: Secondary | ICD-10-CM | POA: Diagnosis not present

## 2020-04-17 DIAGNOSIS — J9621 Acute and chronic respiratory failure with hypoxia: Secondary | ICD-10-CM | POA: Diagnosis not present

## 2020-04-17 DIAGNOSIS — J449 Chronic obstructive pulmonary disease, unspecified: Secondary | ICD-10-CM | POA: Diagnosis not present

## 2020-04-17 DIAGNOSIS — J80 Acute respiratory distress syndrome: Secondary | ICD-10-CM | POA: Diagnosis not present

## 2020-04-17 NOTE — Progress Notes (Signed)
Pulmonary Critical Care Medicine [facility] [place]   PULMONARY CRITICAL CARE SERVICE  PROGRESS NOTE  Date of Service: 04/17/2020  Jaclyn Campbell  [id number]  DOB: 29-Apr-1955   DOA: 03/28/2020  Referring Physician: Carron Curie, MD  [name] is a 64 y.o. female seen for follow up of Acute on Chronic Respiratory Failure.  Patient is on 4 L nasal cannula doing well so far  Medications: Reviewed on Rounds  Physical Exam:  Vitals: Temperature 97.3 pulse 87 respiratory rate 16 blood pressure is 144/82 saturations 90%  Ventilator Settings on 4 L nasal cannula  . General: Comfortable at this time . Eyes: Grossly normal lids, irises & conjunctiva . ENT: grossly tongue is normal . Neck: no obvious mass . Cardiovascular: S1 S2 normal no gallop . Respiratory: No rhonchi no rales are noted at this time . Abdomen: soft . Skin: no rash seen on limited exam . Musculoskeletal: not rigid . Psychiatric:unable to assess . Neurologic: no seizure no involuntary movements         Lab Data:   Basic Metabolic Panel: Recent Labs  Lab 04/14/20 0542 04/15/20 0555  NA 139 137  K 3.1* 3.7  CL 95* 95*  CO2 35* 34*  GLUCOSE 91 99  BUN 15 16  CREATININE 0.84 0.82  CALCIUM 8.4* 8.8*    ABG: No results for input(s): PHART, PCO2ART, PO2ART, HCO3, O2SAT in the last 168 hours.  Liver Function Tests: No results for input(s): AST, ALT, ALKPHOS, BILITOT, PROT, ALBUMIN in the last 168 hours. No results for input(s): LIPASE, AMYLASE in the last 168 hours. No results for input(s): AMMONIA in the last 168 hours.  CBC: Recent Labs  Lab 04/14/20 0542 04/15/20 0555  WBC 10.9* 9.5  HGB 9.3* 9.1*  HCT 30.0* 27.9*  MCV 97.1 95.9  PLT 356 395    Cardiac Enzymes: No results for input(s): CKTOTAL, CKMB, CKMBINDEX, TROPONINI in the last 168 hours.  BNP (last 3 results) No results for input(s): BNP in the last 8760 hours.  ProBNP (last 3 results) No results for  input(s): PROBNP in the last 8760 hours.  Radiological Exams: No results found.  Assessment/Plan Active Problems:   Acute on chronic respiratory failure with hypoxia (HCC)   COVID-19 virus infection   Acute respiratory distress syndrome (ARDS) (HCC)   Pneumonia due to COVID-19 virus   Chronic obstructive asthma (with obstructive pulmonary disease) (HCC)   History of lung cancer   1. Acute on chronic respiratory failure with hypoxia we will continue with oxygen therapy titrate as tolerated 2. COVID-19 virus infection recovery 3. ARDS treated slow improvement 4. Pneumonia due to COVID-19 treated improving 5. Chronic obstructive asthma patient is at baseline 6. History of lung cancer status post resection   I have personally seen and evaluated the patient, evaluated laboratory and imaging results, formulated the assessment and plan and placed orders. The Patient requires high complexity decision making with multiple systems involvement.  Rounds were done with the Respiratory Therapy Director and Staff therapists and discussed with nursing staff also.  Yevonne Pax, MD [facility] Pulmonary Critical Care Medicine Sleep Medicine

## 2020-04-18 DIAGNOSIS — J9621 Acute and chronic respiratory failure with hypoxia: Secondary | ICD-10-CM | POA: Diagnosis not present

## 2020-04-18 DIAGNOSIS — J80 Acute respiratory distress syndrome: Secondary | ICD-10-CM | POA: Diagnosis not present

## 2020-04-18 DIAGNOSIS — J449 Chronic obstructive pulmonary disease, unspecified: Secondary | ICD-10-CM | POA: Diagnosis not present

## 2020-04-18 DIAGNOSIS — U071 COVID-19: Secondary | ICD-10-CM | POA: Diagnosis not present

## 2020-04-18 NOTE — Progress Notes (Signed)
Pulmonary Critical Care Medicine [facility] [place]   PULMONARY CRITICAL CARE SERVICE  PROGRESS NOTE  Date of Service: 04/18/2020  Chrisie Jankovich  [id number]  DOB: 15-Mar-1956   DOA: 03/28/2020  Referring Physician: Carron Curie, MD  [name] is a 64 y.o. female seen for follow up of Acute on Chronic Respiratory Failure.  Patient currently is on 4 L of oxygen good saturations.  Medications: Reviewed on Rounds  Physical Exam:  Vitals: Temperature is 97.1 pulse 84 respiratory 18 blood pressure is 122/77 saturations 100%  Ventilator Settings on 4 L of oxygen  . General: Comfortable at this time . Eyes: Grossly normal lids, irises & conjunctiva . ENT: grossly tongue is normal . Neck: no obvious mass . Cardiovascular: S1 S2 normal no gallop . Respiratory: No rhonchi very coarse breath sound . Abdomen: soft . Skin: no rash seen on limited exam . Musculoskeletal: not rigid . Psychiatric:unable to assess . Neurologic: no seizure no involuntary movements         Lab Data:   Basic Metabolic Panel: Recent Labs  Lab 04/14/20 0542 04/15/20 0555  NA 139 137  K 3.1* 3.7  CL 95* 95*  CO2 35* 34*  GLUCOSE 91 99  BUN 15 16  CREATININE 0.84 0.82  CALCIUM 8.4* 8.8*    ABG: No results for input(s): PHART, PCO2ART, PO2ART, HCO3, O2SAT in the last 168 hours.  Liver Function Tests: No results for input(s): AST, ALT, ALKPHOS, BILITOT, PROT, ALBUMIN in the last 168 hours. No results for input(s): LIPASE, AMYLASE in the last 168 hours. No results for input(s): AMMONIA in the last 168 hours.  CBC: Recent Labs  Lab 04/14/20 0542 04/15/20 0555  WBC 10.9* 9.5  HGB 9.3* 9.1*  HCT 30.0* 27.9*  MCV 97.1 95.9  PLT 356 395    Cardiac Enzymes: No results for input(s): CKTOTAL, CKMB, CKMBINDEX, TROPONINI in the last 168 hours.  BNP (last 3 results) No results for input(s): BNP in the last 8760 hours.  ProBNP (last 3 results) No results for input(s):  PROBNP in the last 8760 hours.  Radiological Exams: No results found.  Assessment/Plan Active Problems:   Acute on chronic respiratory failure with hypoxia (HCC)   COVID-19 virus infection   Acute respiratory distress syndrome (ARDS) (HCC)   Pneumonia due to COVID-19 virus   Chronic obstructive asthma (with obstructive pulmonary disease) (HCC)   History of lung cancer   1. Acute on chronic respiratory failure hypoxia we will continue with 4 L of O2 continue secretion management supportive care. 2. COVID-19 virus infection recovery 3. ARDS treated slow improvement 4. Pneumonia due to COVID-19 improving 5. Chronic asthma at baseline 6. History of lung cancer status post resection   I have personally seen and evaluated the patient, evaluated laboratory and imaging results, formulated the assessment and plan and placed orders. The Patient requires high complexity decision making with multiple systems involvement.  Rounds were done with the Respiratory Therapy Director and Staff therapists and discussed with nursing staff also.  Yevonne Pax, MD [facility] Pulmonary Critical Care Medicine Sleep Medicine

## 2020-04-19 DIAGNOSIS — U071 COVID-19: Secondary | ICD-10-CM | POA: Diagnosis not present

## 2020-04-19 DIAGNOSIS — J449 Chronic obstructive pulmonary disease, unspecified: Secondary | ICD-10-CM | POA: Diagnosis not present

## 2020-04-19 DIAGNOSIS — J9621 Acute and chronic respiratory failure with hypoxia: Secondary | ICD-10-CM | POA: Diagnosis not present

## 2020-04-19 DIAGNOSIS — J80 Acute respiratory distress syndrome: Secondary | ICD-10-CM | POA: Diagnosis not present

## 2020-04-19 NOTE — Progress Notes (Signed)
Pulmonary Critical Care Medicine [facility] [place]   PULMONARY CRITICAL CARE SERVICE  PROGRESS NOTE  Date of Service: 04/19/2020  Jaclyn Campbell  [id number]  DOB: 03-05-1956   DOA: 03/28/2020  Referring Physician: Carron Curie, MD  [name] is a 64 y.o. female seen for follow up of Acute on Chronic Respiratory Failure.  On 4 L using Ventimask  Medications: Reviewed on Rounds  Physical Exam:  Vitals: Temperature is 98.1 pulse 97 respiratory rate is 19 blood pressure is 144/67 saturations 96%  Ventilator Settings on 4 L off the vent  . General: Comfortable at this time . Eyes: Grossly normal lids, irises & conjunctiva . ENT: grossly tongue is normal . Neck: no obvious mass . Cardiovascular: S1 S2 normal no gallop . Respiratory: No rhonchi no rales . Abdomen: soft . Skin: no rash seen on limited exam . Musculoskeletal: not rigid . Psychiatric:unable to assess . Neurologic: no seizure no involuntary movements         Lab Data:   Basic Metabolic Panel: Recent Labs  Lab 04/14/20 0542 04/15/20 0555  NA 139 137  K 3.1* 3.7  CL 95* 95*  CO2 35* 34*  GLUCOSE 91 99  BUN 15 16  CREATININE 0.84 0.82  CALCIUM 8.4* 8.8*    ABG: No results for input(s): PHART, PCO2ART, PO2ART, HCO3, O2SAT in the last 168 hours.  Liver Function Tests: No results for input(s): AST, ALT, ALKPHOS, BILITOT, PROT, ALBUMIN in the last 168 hours. No results for input(s): LIPASE, AMYLASE in the last 168 hours. No results for input(s): AMMONIA in the last 168 hours.  CBC: Recent Labs  Lab 04/14/20 0542 04/15/20 0555  WBC 10.9* 9.5  HGB 9.3* 9.1*  HCT 30.0* 27.9*  MCV 97.1 95.9  PLT 356 395    Cardiac Enzymes: No results for input(s): CKTOTAL, CKMB, CKMBINDEX, TROPONINI in the last 168 hours.  BNP (last 3 results) No results for input(s): BNP in the last 8760 hours.  ProBNP (last 3 results) No results for input(s): PROBNP in the last 8760  hours.  Radiological Exams: No results found.  Assessment/Plan Active Problems:   Acute on chronic respiratory failure with hypoxia (HCC)   COVID-19 virus infection   Acute respiratory distress syndrome (ARDS) (HCC)   Pneumonia due to COVID-19 virus   Chronic obstructive asthma (with obstructive pulmonary disease) (HCC)   History of lung cancer   1. Acute on chronic respiratory failure hypoxia we will continue with oxygen therapy titrate as tolerated 2. COVID-19 virus infection recovery 3. ARDS treated 4. Pneumonia due to COVID-19 treated improving. 5. Chronic obstructive asthma at baseline we will continue to follow 6. History of lung cancer status post resection   I have personally seen and evaluated the patient, evaluated laboratory and imaging results, formulated the assessment and plan and placed orders. The Patient requires high complexity decision making with multiple systems involvement.  Rounds were done with the Respiratory Therapy Director and Staff therapists and discussed with nursing staff also.  Yevonne Pax, MD [facility] Pulmonary Critical Care Medicine Sleep Medicine

## 2020-04-20 DIAGNOSIS — J80 Acute respiratory distress syndrome: Secondary | ICD-10-CM | POA: Diagnosis not present

## 2020-04-20 DIAGNOSIS — J449 Chronic obstructive pulmonary disease, unspecified: Secondary | ICD-10-CM | POA: Diagnosis not present

## 2020-04-20 DIAGNOSIS — J9621 Acute and chronic respiratory failure with hypoxia: Secondary | ICD-10-CM | POA: Diagnosis not present

## 2020-04-20 DIAGNOSIS — U071 COVID-19: Secondary | ICD-10-CM | POA: Diagnosis not present

## 2020-04-20 LAB — CBC
HCT: 27.8 % — ABNORMAL LOW (ref 36.0–46.0)
Hemoglobin: 9.1 g/dL — ABNORMAL LOW (ref 12.0–15.0)
MCH: 31.4 pg (ref 26.0–34.0)
MCHC: 32.7 g/dL (ref 30.0–36.0)
MCV: 95.9 fL (ref 80.0–100.0)
Platelets: 375 10*3/uL (ref 150–400)
RBC: 2.9 MIL/uL — ABNORMAL LOW (ref 3.87–5.11)
RDW: 17.8 % — ABNORMAL HIGH (ref 11.5–15.5)
WBC: 8.8 10*3/uL (ref 4.0–10.5)
nRBC: 0 % (ref 0.0–0.2)

## 2020-04-20 LAB — BASIC METABOLIC PANEL
Anion gap: 12 (ref 5–15)
BUN: 16 mg/dL (ref 8–23)
CO2: 37 mmol/L — ABNORMAL HIGH (ref 22–32)
Calcium: 8.6 mg/dL — ABNORMAL LOW (ref 8.9–10.3)
Chloride: 90 mmol/L — ABNORMAL LOW (ref 98–111)
Creatinine, Ser: 1.16 mg/dL — ABNORMAL HIGH (ref 0.44–1.00)
GFR, Estimated: 52 mL/min — ABNORMAL LOW (ref >60)
Glucose, Bld: 90 mg/dL (ref 70–99)
Potassium: 3.5 mmol/L (ref 3.5–5.1)
Sodium: 139 mmol/L (ref 135–145)

## 2020-04-20 LAB — MAGNESIUM: Magnesium: 1.9 mg/dL (ref 1.7–2.4)

## 2020-04-20 NOTE — Progress Notes (Signed)
Pulmonary Critical Care Medicine [facility] [place]   PULMONARY CRITICAL CARE SERVICE  PROGRESS NOTE  Date of Service: 04/20/2020  Jaclyn Campbell  [id number]  DOB: 1955/12/05   DOA: 03/28/2020  Referring Physician: Carron Curie, MD  [name] is a 65 y.o. female seen for follow up of Acute on Chronic Respiratory Failure.  Patient is on 4 L doing fairly well.  Saturations are good she still requiring Ventimask  Medications: Reviewed on Rounds  Physical Exam:  Vitals: Temperature is 96.3 pulse 83 respiratory rate is 20 blood pressure 124/75 saturations 100%  Ventilator Settings on 4 L nasal cannula  . General: Comfortable at this time . Eyes: Grossly normal lids, irises & conjunctiva . ENT: grossly tongue is normal . Neck: no obvious mass . Cardiovascular: S1 S2 normal no gallop . Respiratory: No rhonchi no rales . Abdomen: soft . Skin: no rash seen on limited exam . Musculoskeletal: not rigid . Psychiatric:unable to assess . Neurologic: no seizure no involuntary movements         Lab Data:   Basic Metabolic Panel: Recent Labs  Lab 04/14/20 0542 04/15/20 0555 04/20/20 0426  NA 139 137 139  K 3.1* 3.7 3.5  CL 95* 95* 90*  CO2 35* 34* 37*  GLUCOSE 91 99 90  BUN 15 16 16   CREATININE 0.84 0.82 1.16*  CALCIUM 8.4* 8.8* 8.6*  MG  --   --  1.9    ABG: No results for input(s): PHART, PCO2ART, PO2ART, HCO3, O2SAT in the last 168 hours.  Liver Function Tests: No results for input(s): AST, ALT, ALKPHOS, BILITOT, PROT, ALBUMIN in the last 168 hours. No results for input(s): LIPASE, AMYLASE in the last 168 hours. No results for input(s): AMMONIA in the last 168 hours.  CBC: Recent Labs  Lab 04/14/20 0542 04/15/20 0555 04/20/20 0426  WBC 10.9* 9.5 8.8  HGB 9.3* 9.1* 9.1*  HCT 30.0* 27.9* 27.8*  MCV 97.1 95.9 95.9  PLT 356 395 375    Cardiac Enzymes: No results for input(s): CKTOTAL, CKMB, CKMBINDEX, TROPONINI in the last 168  hours.  BNP (last 3 results) No results for input(s): BNP in the last 8760 hours.  ProBNP (last 3 results) No results for input(s): PROBNP in the last 8760 hours.  Radiological Exams: No results found.  Assessment/Plan Active Problems:   Acute on chronic respiratory failure with hypoxia (HCC)   COVID-19 virus infection   Acute respiratory distress syndrome (ARDS) (HCC)   Pneumonia due to COVID-19 virus   Chronic obstructive asthma (with obstructive pulmonary disease) (HCC)   History of lung cancer   1. Acute on chronic respiratory failure hypoxia we will continue with oxygen therapy as prescribed continue supportive care pulmonary toilet 2. COVID-19 virus infection recovery 3. ARDS resolving 4. Pneumonia due to COVID-19 improving 5. Chronic asthma we will continue with inhalers as necessary 6. History of lung cancer status post resection   I have personally seen and evaluated the patient, evaluated laboratory and imaging results, formulated the assessment and plan and placed orders. The Patient requires high complexity decision making with multiple systems involvement.  Rounds were done with the Respiratory Therapy Director and Staff therapists and discussed with nursing staff also.  06/18/20, MD [facility] Pulmonary Critical Care Medicine Sleep Medicine

## 2020-04-21 DIAGNOSIS — J80 Acute respiratory distress syndrome: Secondary | ICD-10-CM | POA: Diagnosis not present

## 2020-04-21 DIAGNOSIS — J449 Chronic obstructive pulmonary disease, unspecified: Secondary | ICD-10-CM | POA: Diagnosis not present

## 2020-04-21 DIAGNOSIS — J9621 Acute and chronic respiratory failure with hypoxia: Secondary | ICD-10-CM | POA: Diagnosis not present

## 2020-04-21 DIAGNOSIS — U071 COVID-19: Secondary | ICD-10-CM | POA: Diagnosis not present

## 2020-04-21 NOTE — Progress Notes (Signed)
Pulmonary Critical Care Medicine [facility] [place]   PULMONARY CRITICAL CARE SERVICE  PROGRESS NOTE  Date of Service: 04/21/2020  Jaclyn Campbell  [id number]  DOB: 1955/06/14   DOA: 03/28/2020  Referring Physician: Carron Curie, MD  [name] is a 65 y.o. female seen for follow up of Acute on Chronic Respiratory Failure.  Patient is comfortable right now without distress at this time is on 4 L of oxygen  Medications: Reviewed on Rounds  Physical Exam:  Vitals: Temperature is 96.9 pulse 98 respiratory 23 blood pressure is 130/79 saturations 95%  Ventilator Settings off ventilator on 4 L  . General: Comfortable at this time . Eyes: Grossly normal lids, irises & conjunctiva . ENT: grossly tongue is normal . Neck: no obvious mass . Cardiovascular: S1 S2 normal no gallop . Respiratory: No rhonchi no rales . Abdomen: soft . Skin: no rash seen on limited exam . Musculoskeletal: not rigid . Psychiatric:unable to assess . Neurologic: no seizure no involuntary movements         Lab Data:   Basic Metabolic Panel: Recent Labs  Lab 04/15/20 0555 04/20/20 0426  NA 137 139  K 3.7 3.5  CL 95* 90*  CO2 34* 37*  GLUCOSE 99 90  BUN 16 16  CREATININE 0.82 1.16*  CALCIUM 8.8* 8.6*  MG  --  1.9    ABG: No results for input(s): PHART, PCO2ART, PO2ART, HCO3, O2SAT in the last 168 hours.  Liver Function Tests: No results for input(s): AST, ALT, ALKPHOS, BILITOT, PROT, ALBUMIN in the last 168 hours. No results for input(s): LIPASE, AMYLASE in the last 168 hours. No results for input(s): AMMONIA in the last 168 hours.  CBC: Recent Labs  Lab 04/15/20 0555 04/20/20 0426  WBC 9.5 8.8  HGB 9.1* 9.1*  HCT 27.9* 27.8*  MCV 95.9 95.9  PLT 395 375    Cardiac Enzymes: No results for input(s): CKTOTAL, CKMB, CKMBINDEX, TROPONINI in the last 168 hours.  BNP (last 3 results) No results for input(s): BNP in the last 8760 hours.  ProBNP (last 3 results) No  results for input(s): PROBNP in the last 8760 hours.  Radiological Exams: No results found.  Assessment/Plan Active Problems:   Acute on chronic respiratory failure with hypoxia (HCC)   COVID-19 virus infection   Acute respiratory distress syndrome (ARDS) (HCC)   Pneumonia due to COVID-19 virus   Chronic obstructive asthma (with obstructive pulmonary disease) (HCC)   History of lung cancer   1. Acute on chronic respiratory failure hypoxia we will continue with titrating oxygen as tolerated continue pulmonary toilet 2. COVID-19 virus infection resolved 3. ARDS improved 4. Pneumonia due to COVID-19 improving 5. Chronic asthma medical management 6. History of lung cancer status post resection   I have personally seen and evaluated the patient, evaluated laboratory and imaging results, formulated the assessment and plan and placed orders. The Patient requires high complexity decision making with multiple systems involvement.  Rounds were done with the Respiratory Therapy Director and Staff therapists and discussed with nursing staff also.  Yevonne Pax, MD [facility] Pulmonary Critical Care Medicine Sleep Medicine

## 2020-04-22 DIAGNOSIS — J80 Acute respiratory distress syndrome: Secondary | ICD-10-CM | POA: Diagnosis not present

## 2020-04-22 DIAGNOSIS — J9621 Acute and chronic respiratory failure with hypoxia: Secondary | ICD-10-CM | POA: Diagnosis not present

## 2020-04-22 DIAGNOSIS — J449 Chronic obstructive pulmonary disease, unspecified: Secondary | ICD-10-CM | POA: Diagnosis not present

## 2020-04-22 DIAGNOSIS — U071 COVID-19: Secondary | ICD-10-CM | POA: Diagnosis not present

## 2020-04-22 NOTE — Progress Notes (Signed)
Pulmonary Critical Care Medicine [facility] [place]   PULMONARY CRITICAL CARE SERVICE  PROGRESS NOTE  Date of Service: 04/22/2020  Loreley Schwall  MWN:[id number]  DOB: 09/25/1955   DOA: 03/28/2020  Referring Physician: Carron Curie, MD  [name] is a 65 y.o. female seen for follow up of Acute on Chronic Respiratory Failure.  Currently is on 4 L of oxygen good saturations are noted  Medications: Reviewed on Rounds  Physical Exam:  Vitals: Temperature is 97.7 pulse 86 respiratory rate 16 blood pressure is 149/81 saturations 97  Ventilator Settings on 4 L of O2  . General: Comfortable at this time . Eyes: Grossly normal lids, irises & conjunctiva . ENT: grossly tongue is normal . Neck: no obvious mass . Cardiovascular: S1 S2 normal no gallop . Respiratory: No rhonchi no rales noted at this time . Abdomen: soft . Skin: no rash seen on limited exam . Musculoskeletal: not rigid . Psychiatric:unable to assess . Neurologic: no seizure no involuntary movements         Lab Data:   Basic Metabolic Panel: Recent Labs  Lab 04/20/20 0426  NA 139  K 3.5  CL 90*  CO2 37*  GLUCOSE 90  BUN 16  CREATININE 1.16*  CALCIUM 8.6*  MG 1.9    ABG: No results for input(s): PHART, PCO2ART, PO2ART, HCO3, O2SAT in the last 168 hours.  Liver Function Tests: No results for input(s): AST, ALT, ALKPHOS, BILITOT, PROT, ALBUMIN in the last 168 hours. No results for input(s): LIPASE, AMYLASE in the last 168 hours. No results for input(s): AMMONIA in the last 168 hours.  CBC: Recent Labs  Lab 04/20/20 0426  WBC 8.8  HGB 9.1*  HCT 27.8*  MCV 95.9  PLT 375    Cardiac Enzymes: No results for input(s): CKTOTAL, CKMB, CKMBINDEX, TROPONINI in the last 168 hours.  BNP (last 3 results) No results for input(s): BNP in the last 8760 hours.  ProBNP (last 3 results) No results for input(s): PROBNP in the last 8760 hours.  Radiological Exams: No results  found.  Assessment/Plan Active Problems:   Acute on chronic respiratory failure with hypoxia (HCC)   COVID-19 virus infection   Acute respiratory distress syndrome (ARDS) (HCC)   Pneumonia due to COVID-19 virus   Chronic obstructive asthma (with obstructive pulmonary disease) (HCC)   History of lung cancer   1. Acute on chronic respiratory failure with hypoxia we will continue with 4 L oxygen titrate as tolerated 2. COVID-19 virus infection recovery 3. ARDS improved 4. Pneumonia due to COVID-19 improved 5. COPD at baseline we will continue to monitor 6. History of lung cancer status post   I have personally seen and evaluated the patient, evaluated laboratory and imaging results, formulated the assessment and plan and placed orders. The Patient requires high complexity decision making with multiple systems involvement.  Rounds were done with the Respiratory Therapy Director and Staff therapists and discussed with nursing staff also.  Yevonne Pax, MD [facility] Pulmonary Critical Care Medicine Sleep Medicine

## 2020-04-23 DIAGNOSIS — J449 Chronic obstructive pulmonary disease, unspecified: Secondary | ICD-10-CM | POA: Diagnosis not present

## 2020-04-23 DIAGNOSIS — J80 Acute respiratory distress syndrome: Secondary | ICD-10-CM | POA: Diagnosis not present

## 2020-04-23 DIAGNOSIS — J9621 Acute and chronic respiratory failure with hypoxia: Secondary | ICD-10-CM | POA: Diagnosis not present

## 2020-04-23 DIAGNOSIS — U071 COVID-19: Secondary | ICD-10-CM | POA: Diagnosis not present

## 2020-04-23 LAB — NOVEL CORONAVIRUS, NAA (HOSP ORDER, SEND-OUT TO REF LAB; TAT 18-24 HRS): SARS-CoV-2, NAA: NOT DETECTED

## 2020-04-23 NOTE — Progress Notes (Signed)
Pulmonary Critical Care Medicine [facility] [place]   PULMONARY CRITICAL CARE SERVICE  PROGRESS NOTE  Date of Service: 04/23/2020  Jaclyn Campbell  BRA:[id number]  DOB: November 29, 1955   DOA: 03/28/2020  Referring Physician: Carron Curie, MD  [name] is a 65 y.o. female seen for follow up of Acute on Chronic Respiratory Failure.  Patient currently is on 4 L of oxygen with saturations are 90  Medications: Reviewed on Rounds  Physical Exam:  Vitals: Temperature is 97.0 pulse 87 respiratory rate 20 blood pressure is 126/80 saturations 97%  Ventilator Settings on 4 L O2  . General: Comfortable at this time . Eyes: Grossly normal lids, irises & conjunctiva . ENT: grossly tongue is normal . Neck: no obvious mass . Cardiovascular: S1 S2 normal no gallop . Respiratory: No rhonchi no rales noted at this time . Abdomen: soft . Skin: no rash seen on limited exam . Musculoskeletal: not rigid . Psychiatric:unable to assess . Neurologic: no seizure no involuntary movements         Lab Data:   Basic Metabolic Panel: Recent Labs  Lab 04/20/20 0426  NA 139  K 3.5  CL 90*  CO2 37*  GLUCOSE 90  BUN 16  CREATININE 1.16*  CALCIUM 8.6*  MG 1.9    ABG: No results for input(s): PHART, PCO2ART, PO2ART, HCO3, O2SAT in the last 168 hours.  Liver Function Tests: No results for input(s): AST, ALT, ALKPHOS, BILITOT, PROT, ALBUMIN in the last 168 hours. No results for input(s): LIPASE, AMYLASE in the last 168 hours. No results for input(s): AMMONIA in the last 168 hours.  CBC: Recent Labs  Lab 04/20/20 0426  WBC 8.8  HGB 9.1*  HCT 27.8*  MCV 95.9  PLT 375    Cardiac Enzymes: No results for input(s): CKTOTAL, CKMB, CKMBINDEX, TROPONINI in the last 168 hours.  BNP (last 3 results) No results for input(s): BNP in the last 8760 hours.  ProBNP (last 3 results) No results for input(s): PROBNP in the last 8760 hours.  Radiological Exams: No results  found.  Assessment/Plan Active Problems:   Acute on chronic respiratory failure with hypoxia (HCC)   COVID-19 virus infection   Acute respiratory distress syndrome (ARDS) (HCC)   Pneumonia due to COVID-19 virus   Chronic obstructive asthma (with obstructive pulmonary disease) (HCC)   History of lung cancer   1. Acute on chronic respiratory failure hypoxia we will continue with oxygen therapy titrate as tolerated continue with pulmonary toilet 2. COVID-19 virus infection recovery 3. ARDS treated we will continue to monitor 4. Pneumonia due to COVID-19 patient is in recovery 5. Chronic asthma supportive care 6. History of lung cancer status post resection   I have personally seen and evaluated the patient, evaluated laboratory and imaging results, formulated the assessment and plan and placed orders. The Patient requires high complexity decision making with multiple systems involvement.  Rounds were done with the Respiratory Therapy Director and Staff therapists and discussed with nursing staff also.  Yevonne Pax, MD [facility] Pulmonary Critical Care Medicine Sleep Medicine

## 2020-04-24 LAB — BASIC METABOLIC PANEL
Anion gap: 12 (ref 5–15)
BUN: 10 mg/dL (ref 8–23)
CO2: 35 mmol/L — ABNORMAL HIGH (ref 22–32)
Calcium: 8.8 mg/dL — ABNORMAL LOW (ref 8.9–10.3)
Chloride: 93 mmol/L — ABNORMAL LOW (ref 98–111)
Creatinine, Ser: 1.11 mg/dL — ABNORMAL HIGH (ref 0.44–1.00)
GFR, Estimated: 55 mL/min — ABNORMAL LOW (ref >60)
Glucose, Bld: 88 mg/dL (ref 70–99)
Potassium: 4.1 mmol/L (ref 3.5–5.1)
Sodium: 140 mmol/L (ref 135–145)

## 2020-04-24 LAB — CBC
HCT: 31.3 % — ABNORMAL LOW (ref 36.0–46.0)
Hemoglobin: 9.9 g/dL — ABNORMAL LOW (ref 12.0–15.0)
MCH: 30.6 pg (ref 26.0–34.0)
MCHC: 31.6 g/dL (ref 30.0–36.0)
MCV: 96.6 fL (ref 80.0–100.0)
Platelets: 403 10*3/uL — ABNORMAL HIGH (ref 150–400)
RBC: 3.24 MIL/uL — ABNORMAL LOW (ref 3.87–5.11)
RDW: 16.7 % — ABNORMAL HIGH (ref 11.5–15.5)
WBC: 8.3 10*3/uL (ref 4.0–10.5)
nRBC: 0 % (ref 0.0–0.2)

## 2020-04-24 LAB — PHOSPHORUS: Phosphorus: 4.7 mg/dL — ABNORMAL HIGH (ref 2.5–4.6)

## 2020-04-24 LAB — MAGNESIUM: Magnesium: 2.1 mg/dL (ref 1.7–2.4)

## 2020-04-25 DIAGNOSIS — U071 COVID-19: Secondary | ICD-10-CM | POA: Diagnosis not present

## 2020-04-25 DIAGNOSIS — J80 Acute respiratory distress syndrome: Secondary | ICD-10-CM | POA: Diagnosis not present

## 2020-04-25 DIAGNOSIS — J449 Chronic obstructive pulmonary disease, unspecified: Secondary | ICD-10-CM | POA: Diagnosis not present

## 2020-04-25 DIAGNOSIS — J9621 Acute and chronic respiratory failure with hypoxia: Secondary | ICD-10-CM | POA: Diagnosis not present

## 2020-04-25 NOTE — Progress Notes (Signed)
Pulmonary Critical Care Medicine [facility] [place]   PULMONARY CRITICAL CARE SERVICE  PROGRESS NOTE  Date of Service: 04/25/2020  Jaclyn Campbell  AQT:[id number]  DOB: December 28, 1955   DOA: 03/28/2020  Referring Physician: Carron Curie, MD  [name] is a 65 y.o. female seen for follow up of Acute on Chronic Respiratory Failure.  She is doing fine currently on 4 L of oxygen good saturations are noted.  Medications: Reviewed on Rounds  Physical Exam:  Vitals: Temperature is 96.7 pulse 90 respiratory rate 12 blood pressure is 117/76 saturations 94  Ventilator Settings on 4 L of oxygen  . General: Comfortable at this time . Eyes: Grossly normal lids, irises & conjunctiva . ENT: grossly tongue is normal . Neck: no obvious mass . Cardiovascular: S1 S2 normal no gallop . Respiratory: No rhonchi very coarse breath sounds . Abdomen: soft . Skin: no rash seen on limited exam . Musculoskeletal: not rigid . Psychiatric:unable to assess . Neurologic: no seizure no involuntary movements         Lab Data:   Basic Metabolic Panel: Recent Labs  Lab 04/20/20 0426 04/24/20 0628  NA 139 140  K 3.5 4.1  CL 90* 93*  CO2 37* 35*  GLUCOSE 90 88  BUN 16 10  CREATININE 1.16* 1.11*  CALCIUM 8.6* 8.8*  MG 1.9 2.1  PHOS  --  4.7*    ABG: No results for input(s): PHART, PCO2ART, PO2ART, HCO3, O2SAT in the last 168 hours.  Liver Function Tests: No results for input(s): AST, ALT, ALKPHOS, BILITOT, PROT, ALBUMIN in the last 168 hours. No results for input(s): LIPASE, AMYLASE in the last 168 hours. No results for input(s): AMMONIA in the last 168 hours.  CBC: Recent Labs  Lab 04/20/20 0426 04/24/20 0628  WBC 8.8 8.3  HGB 9.1* 9.9*  HCT 27.8* 31.3*  MCV 95.9 96.6  PLT 375 403*    Cardiac Enzymes: No results for input(s): CKTOTAL, CKMB, CKMBINDEX, TROPONINI in the last 168 hours.  BNP (last 3 results) No results for input(s): BNP in the last 8760  hours.  ProBNP (last 3 results) No results for input(s): PROBNP in the last 8760 hours.  Radiological Exams: No results found.  Assessment/Plan Active Problems:   Acute on chronic respiratory failure with hypoxia (HCC)   COVID-19 virus infection   Acute respiratory distress syndrome (ARDS) (HCC)   Pneumonia due to COVID-19 virus   Chronic obstructive asthma (with obstructive pulmonary disease) (HCC)   History of lung cancer   1. Acute on chronic respiratory failure with hypoxia we will continue with oxygen therapy titrate as tolerated. 2. COVID-19 virus infection recovery 3. ARDS treated clinically appears to be improving 4. Pneumonia due to COVID-19 treatment we will continue to follow 5. Chronic asthma at baseline 6. History of lung cancer status post resection   I have personally seen and evaluated the patient, evaluated laboratory and imaging results, formulated the assessment and plan and placed orders. The Patient requires high complexity decision making with multiple systems involvement.  Rounds were done with the Respiratory Therapy Director and Staff therapists and discussed with nursing staff also.  Yevonne Pax, MD [facility] Pulmonary Critical Care Medicine Sleep Medicine

## 2021-08-13 IMAGING — DX DG CHEST 1V PORT
1 series · 1 of 1 positions shown · non-contrast
Comparison: Three days ago

CLINICAL DATA: Pneumonia

EXAM:
PORTABLE CHEST 1 VIEW

[chest ap]
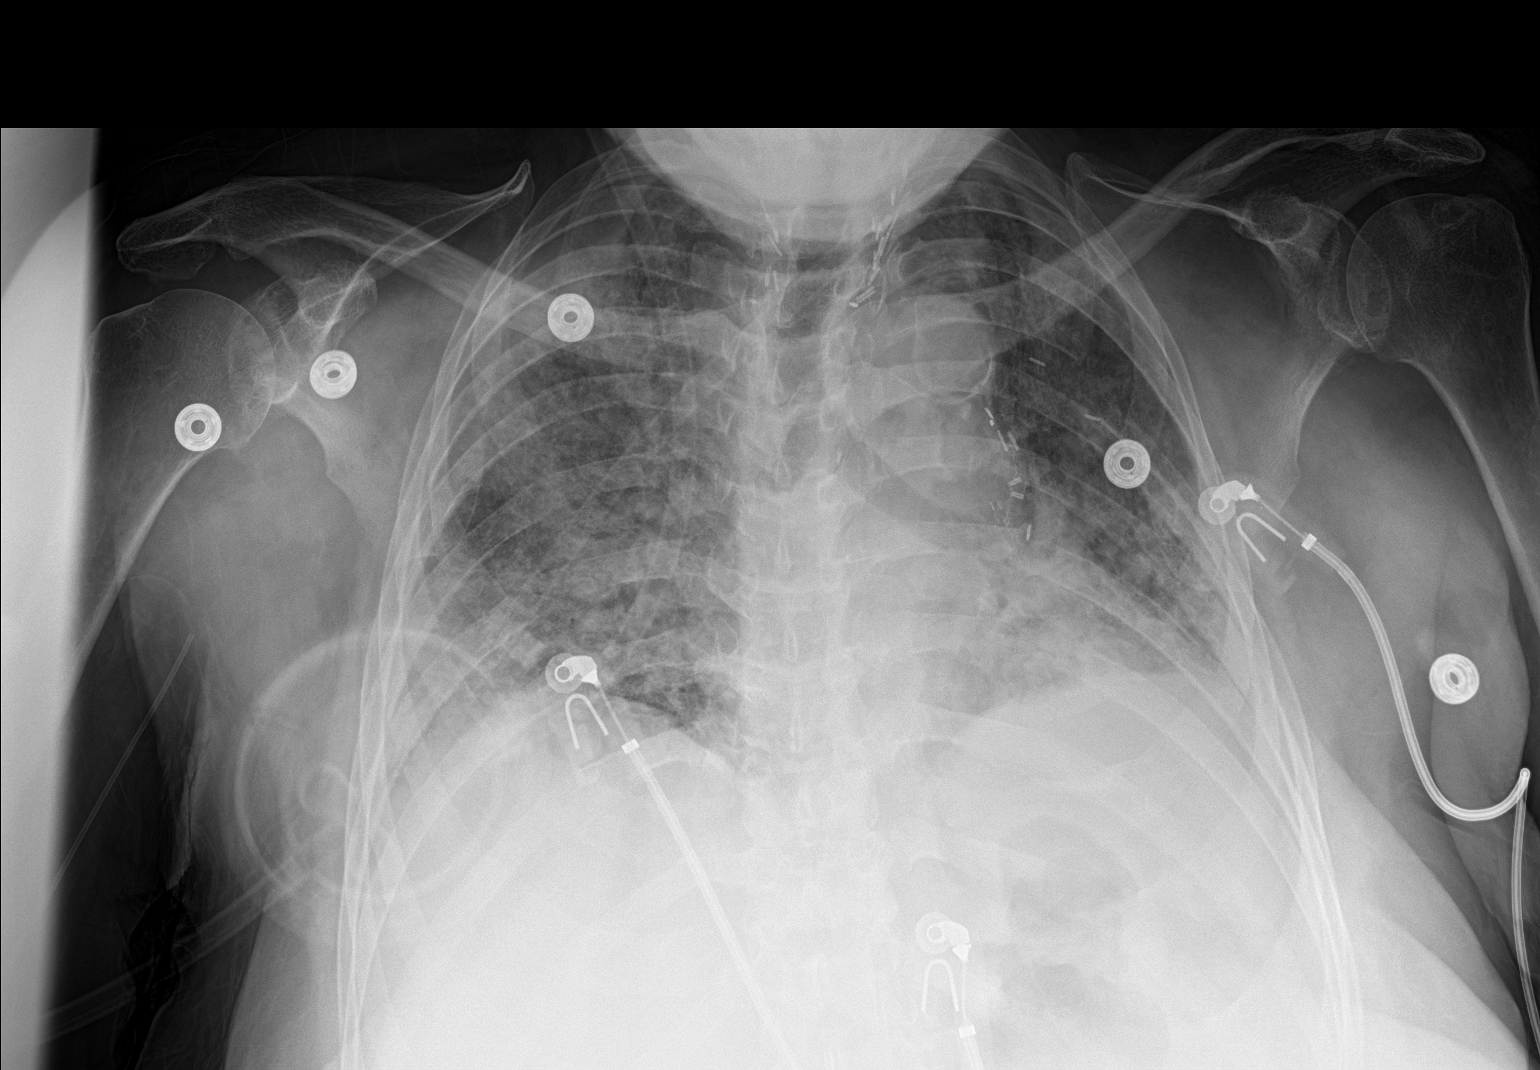

[1 of 1 positions shown; findings below may reference images not displayed]

FINDINGS: Low volume chest with extensive airspace disease. Postoperative
chest and thoracic inlet. No visible effusion or pneumothorax.
IMPRESSION: Stable low volume chest and extensive airspace disease.

## 2021-08-18 IMAGING — DX DG CHEST 1V
1 series · 1 of 1 positions shown · non-contrast
Comparison: Portable chest 04/13/2020 and earlier.

CLINICAL DATA: 64-year-old female with [id number]. Respiratory
failure.

EXAM:
CHEST  1 VIEW

[chest]
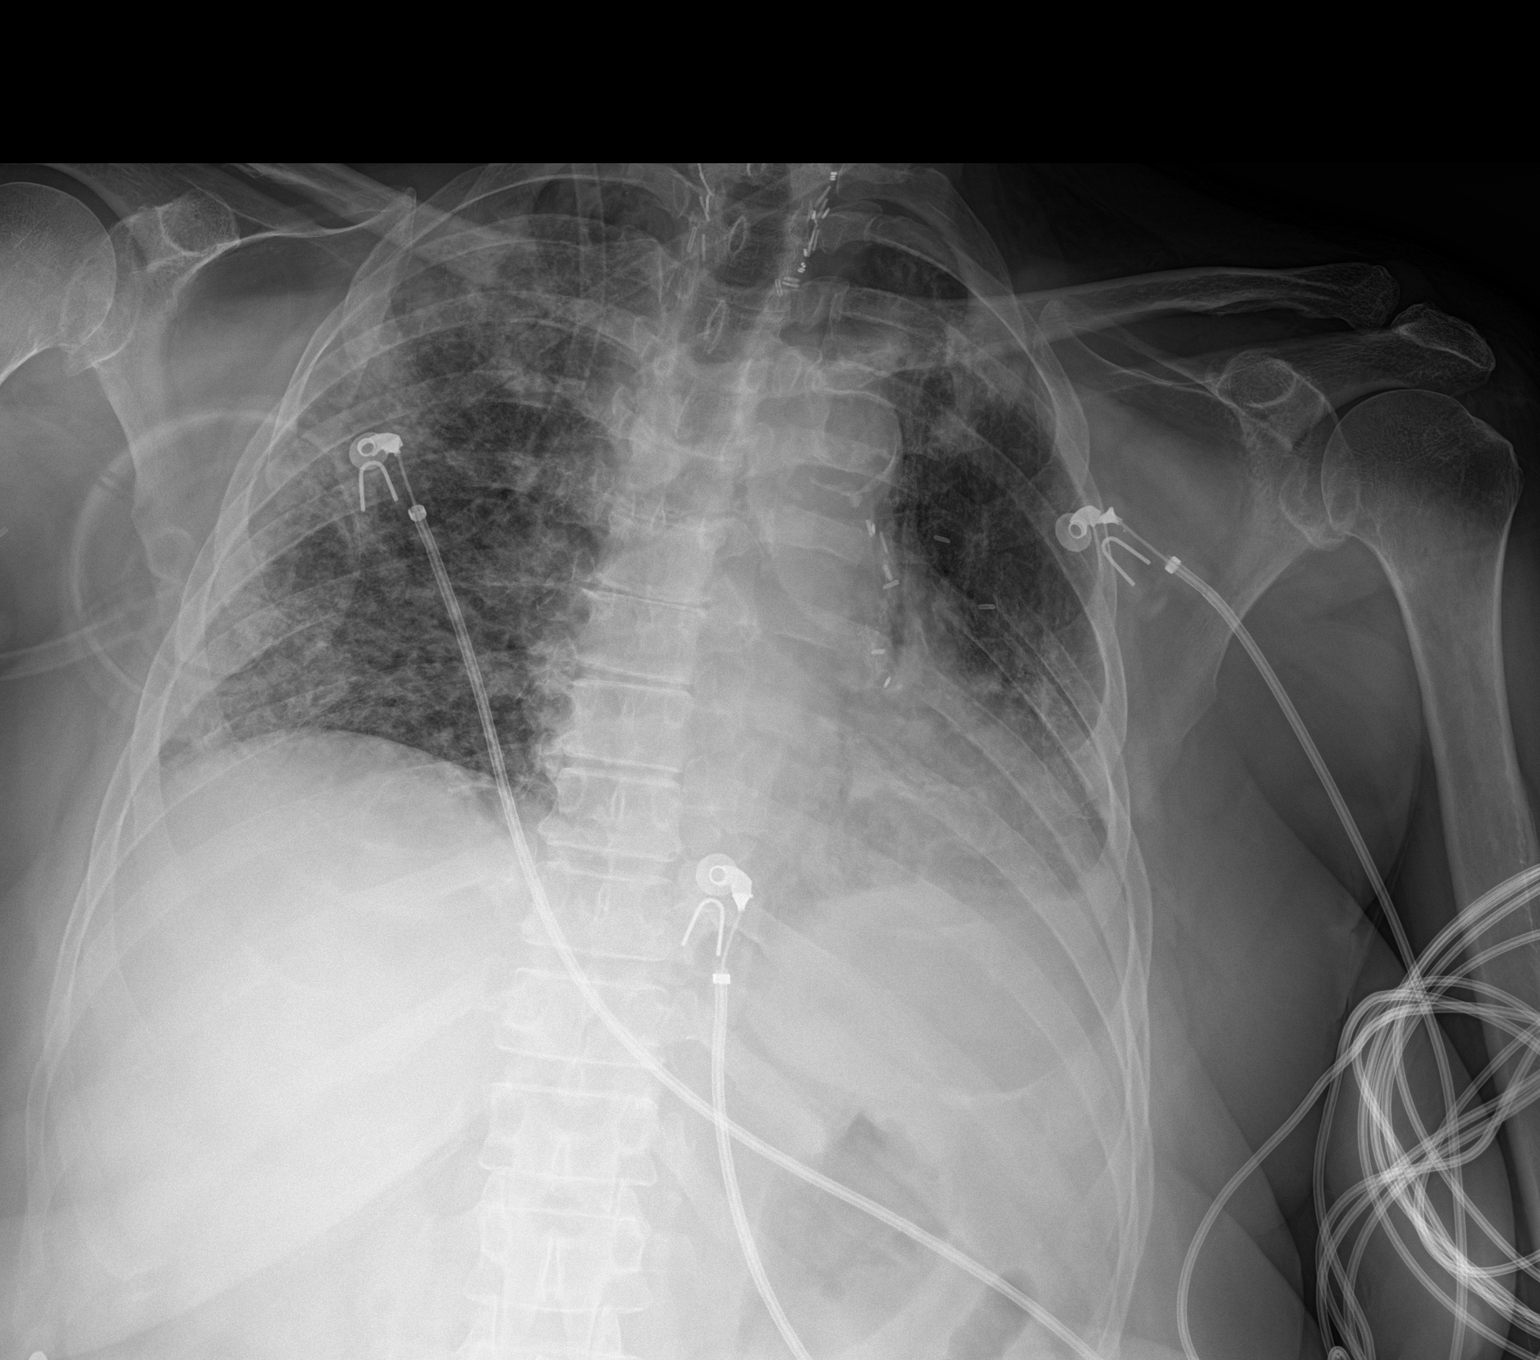

[1 of 1 positions shown; findings below may reference images not displayed]

FINDINGS: Portable AP semi upright view at 0838 hours. Mildly improved lung
volumes. Stable cardiac size and mediastinal contours. Numerous
surgical clips along the bilateral thoracic inlet and left heart
border are stable. Patchy and coarse bilateral pulmonary opacity
with moderately improved ventilation compared to 04/11/2020.
Possible small left pleural effusion. No pneumothorax or areas of
worsening ventilation from 2 days ago.

Negative visible bowel gas pattern, osseous structures.
IMPRESSION: 1. Bilateral pneumonia with improved ventilation from 04/11/2020.
Possible small left pleural effusion.
2. No new cardiopulmonary abnormality.
